# Patient Record
Sex: Female | Born: 1945
Health system: Southern US, Community
[De-identification: ages and names within clinical notes are randomized; demographics above are authoritative.]

## PROBLEM LIST (undated history)

## (undated) DIAGNOSIS — F419 Anxiety disorder, unspecified: Secondary | ICD-10-CM

## (undated) DIAGNOSIS — I10 Essential (primary) hypertension: Secondary | ICD-10-CM

## (undated) DIAGNOSIS — M5416 Radiculopathy, lumbar region: Secondary | ICD-10-CM

## (undated) DIAGNOSIS — Z923 Personal history of irradiation: Secondary | ICD-10-CM

## (undated) DIAGNOSIS — Z9221 Personal history of antineoplastic chemotherapy: Secondary | ICD-10-CM

## (undated) DIAGNOSIS — C801 Malignant (primary) neoplasm, unspecified: Secondary | ICD-10-CM

## (undated) DIAGNOSIS — J45909 Unspecified asthma, uncomplicated: Secondary | ICD-10-CM

## (undated) DIAGNOSIS — G629 Polyneuropathy, unspecified: Secondary | ICD-10-CM

## (undated) HISTORY — PX: ABDOMINAL HYSTERECTOMY: SHX81

## (undated) HISTORY — PX: EYE SURGERY: SHX253

## (undated) HISTORY — PX: TUBAL LIGATION: SHX77

## (undated) HISTORY — PX: CHOLECYSTECTOMY: SHX55

## (undated) HISTORY — DX: Polyneuropathy, unspecified: G62.9

## (undated) HISTORY — PX: BACK SURGERY: SHX140

## (undated) HISTORY — DX: Radiculopathy, lumbar region: M54.16

---

## 1995-04-21 DIAGNOSIS — C801 Malignant (primary) neoplasm, unspecified: Secondary | ICD-10-CM

## 1995-04-21 HISTORY — DX: Malignant (primary) neoplasm, unspecified: C80.1

## 1995-04-21 HISTORY — PX: BREAST LUMPECTOMY: SHX2

## 2004-08-28 ENCOUNTER — Encounter: Admission: RE | Admit: 2004-08-28 | Discharge: 2004-08-28 | Payer: Self-pay | Admitting: Internal Medicine

## 2004-09-19 ENCOUNTER — Encounter (INDEPENDENT_AMBULATORY_CARE_PROVIDER_SITE_OTHER): Payer: Self-pay | Admitting: *Deleted

## 2004-09-19 ENCOUNTER — Encounter: Admission: RE | Admit: 2004-09-19 | Discharge: 2004-09-19 | Payer: Self-pay | Admitting: Internal Medicine

## 2004-10-01 ENCOUNTER — Encounter: Admission: RE | Admit: 2004-10-01 | Discharge: 2004-10-01 | Payer: Self-pay | Admitting: Internal Medicine

## 2004-10-09 ENCOUNTER — Encounter: Admission: RE | Admit: 2004-10-09 | Discharge: 2004-10-09 | Payer: Self-pay | Admitting: Internal Medicine

## 2004-12-31 ENCOUNTER — Other Ambulatory Visit: Admission: RE | Admit: 2004-12-31 | Discharge: 2004-12-31 | Payer: Self-pay | Admitting: Internal Medicine

## 2005-02-04 ENCOUNTER — Encounter: Admission: RE | Admit: 2005-02-04 | Discharge: 2005-04-19 | Payer: Self-pay | Admitting: Internal Medicine

## 2005-09-01 ENCOUNTER — Encounter: Admission: RE | Admit: 2005-09-01 | Discharge: 2005-09-01 | Payer: Self-pay | Admitting: Internal Medicine

## 2006-01-05 ENCOUNTER — Other Ambulatory Visit: Admission: RE | Admit: 2006-01-05 | Discharge: 2006-01-05 | Payer: Self-pay | Admitting: Internal Medicine

## 2006-07-26 ENCOUNTER — Ambulatory Visit (HOSPITAL_COMMUNITY): Admission: RE | Admit: 2006-07-26 | Discharge: 2006-07-26 | Payer: Self-pay | Admitting: Ophthalmology

## 2006-09-07 ENCOUNTER — Encounter: Admission: RE | Admit: 2006-09-07 | Discharge: 2006-09-07 | Payer: Self-pay | Admitting: *Deleted

## 2007-09-14 ENCOUNTER — Encounter: Admission: RE | Admit: 2007-09-14 | Discharge: 2007-09-14 | Payer: Self-pay | Admitting: Family Medicine

## 2007-09-28 ENCOUNTER — Encounter: Admission: RE | Admit: 2007-09-28 | Discharge: 2007-09-28 | Payer: Self-pay | Admitting: Family Medicine

## 2008-09-25 ENCOUNTER — Encounter: Admission: RE | Admit: 2008-09-25 | Discharge: 2008-09-25 | Payer: Self-pay | Admitting: Family Medicine

## 2008-11-19 ENCOUNTER — Encounter: Admission: RE | Admit: 2008-11-19 | Discharge: 2008-11-19 | Payer: Self-pay | Admitting: Family Medicine

## 2009-10-01 ENCOUNTER — Encounter: Admission: RE | Admit: 2009-10-01 | Discharge: 2009-10-01 | Payer: Self-pay | Admitting: Family Medicine

## 2010-05-11 ENCOUNTER — Encounter: Payer: Self-pay | Admitting: Internal Medicine

## 2010-05-11 ENCOUNTER — Encounter: Payer: Self-pay | Admitting: Family Medicine

## 2010-05-28 ENCOUNTER — Other Ambulatory Visit: Payer: Self-pay | Admitting: Family Medicine

## 2010-05-28 DIAGNOSIS — Z9889 Other specified postprocedural states: Secondary | ICD-10-CM

## 2010-05-28 DIAGNOSIS — Z1231 Encounter for screening mammogram for malignant neoplasm of breast: Secondary | ICD-10-CM

## 2010-08-20 ENCOUNTER — Other Ambulatory Visit: Payer: Self-pay | Admitting: Orthopaedic Surgery

## 2010-08-20 DIAGNOSIS — M545 Low back pain, unspecified: Secondary | ICD-10-CM

## 2010-09-05 NOTE — Op Note (Signed)
Sylvia Simpson, Sylvia Simpson                ACCOUNT NO.:  [id number]   MEDICAL RECORD NO.:  [id number]          PATIENT TYPE:  AMB   LOCATION:  SDS                          FACILITY:  [facility]   PHYSICIAN:  Jillyn Hidden A. Rankin, M.D.   DATE OF BIRTH:  1945-11-24   DATE OF PROCEDURE:  07/26/2006  DATE OF DISCHARGE:  07/26/2006                               OPERATIVE REPORT   PREOPERATIVE DIAGNOSIS:  Macular hole, right eye.   POSTOPERATIVE DIAGNOSES:  1. Macular hole, right eye.  2. __________  holes, inferonasally.   PROCEDURES PERFORMED:  1. Posterior vitrectomy with membrane peel using indocyanine green      (ICG) ___dye  delineation - internal limiting membrane.  2. Pars plana vitrectomy endolaser photocoagulation, local, right eye      - 25 gauge.   SURGEON:  Alford Highland. Rankin, M.D.   ANESTHESIA:  Local with monitored anesthesia control.   INDICATIONS FOR PROCEDURE:  The patient is a 65 year old woman who has  profound vision loss on the basis of a macular hole, idiopathic, of the  right eye.  The patient understands this is an attempt to close the  macular hole.  She understands the risks of anesthesia, including the  rare occurrence of death, loss of the eye, including limbic condition as  well as the underlying repair, but not limited to hemorrhage, infection,  scarring, need for further surgery, no change in vision, __________ .   DESCRIPTION OF PROCEDURE:  In the operating room, appropriate monitoring  was followed by sedation.  The globe was anesthetized using 0.75%  Marcaine retrobulbar with an additional 5 mL lidocaine/0.5 of Marcaine.  The right periocular region was sterilely prepped and draped in the  usual sterile fashion.  A lid speculum was applied.  Next, a 25-gauge  trocar system was placed in the inferotemporal quadrant.  A superior  trocar was applied.  _______infusion___  was then begun.  Physician  induced posterior vitreous detachment was created.  The vitreous  detachment was created.  Vitreous _skirt elevated  360 degrees and  trimmed.  At this time, fluid air exchange completed.  A small amount of  BSS was left over the posterior hole and dilute 1 mL of ICG with 5 mL of  BSS injected over the posterior hole.  This was immediately aspirated  with a soft-tipped silicone needle.  Thereafter, an air fluid exchange  was completed.  Under fluid, the internal membrane was easily identified  and was run in a continuous circular fashion from around the edge of the  macular hole.  Additional findings of retinal holes inferonasal and one  inferotemporally.  These were treated with local laser photocoagulation.  Excellent mobilization of the edge of the macular hole was completed.  At this time, a fluid air exchange was completed.  At this time, an air-  SF-6 exchange completed with 20% SF-6.   The superior trocar was removed.  The infusion was removed.  Conjunctival Decadron applied.  Sterile packs and Fox shield applied.  The patient tolerated the procedure without complication.      Alford Highland  Rankin, M.D.  Electronically Signed     GAR/MEDQ  D:  09/19/2006  T:  09/19/2006  Job:  [id number]

## 2010-10-07 ENCOUNTER — Ambulatory Visit
Admission: RE | Admit: 2010-10-07 | Discharge: 2010-10-07 | Disposition: A | Discharge status: Home / Self Care | Payer: BLUE CROSS/BLUE SHIELD | Source: Ambulatory Visit | Attending: Family Medicine | Admitting: Family Medicine

## 2010-10-07 DIAGNOSIS — Z9889 Other specified postprocedural states: Secondary | ICD-10-CM

## 2010-10-07 DIAGNOSIS — Z1231 Encounter for screening mammogram for malignant neoplasm of breast: Secondary | ICD-10-CM

## 2010-12-11 ENCOUNTER — Ambulatory Visit
Admission: RE | Admit: 2010-12-11 | Discharge: 2010-12-11 | Disposition: A | Discharge status: Home / Self Care | Payer: BLUE CROSS/BLUE SHIELD | Source: Ambulatory Visit | Attending: Orthopaedic Surgery | Admitting: Orthopaedic Surgery

## 2010-12-11 DIAGNOSIS — M545 Low back pain, unspecified: Secondary | ICD-10-CM

## 2011-01-22 ENCOUNTER — Other Ambulatory Visit (HOSPITAL_COMMUNITY): Payer: Self-pay | Admitting: Orthopaedic Surgery

## 2011-01-22 ENCOUNTER — Ambulatory Visit (HOSPITAL_COMMUNITY)
Admission: RE | Admit: 2011-01-22 | Discharge: 2011-01-22 | Disposition: A | Discharge status: Home / Self Care | Payer: Medicare Other | Source: Ambulatory Visit | Attending: Orthopaedic Surgery | Admitting: Orthopaedic Surgery

## 2011-01-22 ENCOUNTER — Encounter (HOSPITAL_COMMUNITY)
Admission: RE | Admit: 2011-01-22 | Discharge: 2011-01-22 | Disposition: A | Discharge status: Home / Self Care | Payer: Medicare Other | Source: Ambulatory Visit | Attending: Orthopaedic Surgery | Admitting: Orthopaedic Surgery

## 2011-01-22 DIAGNOSIS — I517 Cardiomegaly: Secondary | ICD-10-CM | POA: Insufficient documentation

## 2011-01-22 DIAGNOSIS — I440 Atrioventricular block, first degree: Secondary | ICD-10-CM | POA: Insufficient documentation

## 2011-01-22 DIAGNOSIS — Z01812 Encounter for preprocedural laboratory examination: Secondary | ICD-10-CM | POA: Insufficient documentation

## 2011-01-22 DIAGNOSIS — Z01811 Encounter for preprocedural respiratory examination: Secondary | ICD-10-CM

## 2011-01-22 DIAGNOSIS — Z01818 Encounter for other preprocedural examination: Secondary | ICD-10-CM | POA: Insufficient documentation

## 2011-01-22 DIAGNOSIS — Z01810 Encounter for preprocedural cardiovascular examination: Secondary | ICD-10-CM | POA: Insufficient documentation

## 2011-01-22 LAB — TYPE AND SCREEN
ABO/RH(D): A POS
Antibody Screen: NEGATIVE

## 2011-01-22 LAB — URINALYSIS, ROUTINE W REFLEX MICROSCOPIC
Leukocytes, UA: NEGATIVE
Protein, ur: NEGATIVE mg/dL
Urobilinogen, UA: 0.2 mg/dL (ref 0.0–1.0)

## 2011-01-22 LAB — DIFFERENTIAL
Basophils Absolute: 0 10*3/uL (ref 0.0–0.1)
Eosinophils Absolute: 0.1 10*3/uL (ref 0.0–0.7)
Monocytes Absolute: 0.4 10*3/uL (ref 0.1–1.0)
Monocytes Relative: 7 % (ref 3–12)

## 2011-01-22 LAB — PROTIME-INR
INR: 1.03 (ref 0.00–1.49)
Prothrombin Time: 13.7 seconds (ref 11.6–15.2)

## 2011-01-22 LAB — BASIC METABOLIC PANEL
Calcium: 11.5 mg/dL — ABNORMAL HIGH (ref 8.4–10.5)
GFR calc non Af Amer: 52 mL/min — ABNORMAL LOW (ref >90)
Sodium: 141 mEq/L (ref 135–145)

## 2011-01-22 LAB — CBC
Platelets: 184 10*3/uL (ref 150–400)
RBC: 4.19 MIL/uL (ref 3.87–5.11)
WBC: 5 10*3/uL (ref 4.0–10.5)

## 2011-01-22 LAB — ABO/RH: ABO/RH(D): A POS

## 2011-02-04 ENCOUNTER — Inpatient Hospital Stay (HOSPITAL_COMMUNITY)
Admission: RE | Admit: 2011-02-04 | Discharge: 2011-02-10 | DRG: 460 | Disposition: A | Discharge status: Home Health | Payer: Medicare Other | Source: Ambulatory Visit | Attending: Orthopaedic Surgery | Admitting: Orthopaedic Surgery

## 2011-02-04 ENCOUNTER — Inpatient Hospital Stay (HOSPITAL_COMMUNITY): Payer: Medicare Other

## 2011-02-04 DIAGNOSIS — M5126 Other intervertebral disc displacement, lumbar region: Principal | ICD-10-CM | POA: Diagnosis present

## 2011-02-04 DIAGNOSIS — M4310 Spondylolisthesis, site unspecified: Secondary | ICD-10-CM | POA: Diagnosis present

## 2011-02-04 DIAGNOSIS — I10 Essential (primary) hypertension: Secondary | ICD-10-CM | POA: Diagnosis present

## 2011-02-04 DIAGNOSIS — D72829 Elevated white blood cell count, unspecified: Secondary | ICD-10-CM | POA: Diagnosis not present

## 2011-02-04 DIAGNOSIS — J9819 Other pulmonary collapse: Secondary | ICD-10-CM | POA: Diagnosis not present

## 2011-02-05 LAB — BASIC METABOLIC PANEL
BUN: 19 mg/dL (ref 6–23)
GFR calc non Af Amer: 44 mL/min — ABNORMAL LOW (ref >90)
Glucose, Bld: 349 mg/dL — ABNORMAL HIGH (ref 70–99)
Potassium: 5.2 mEq/L — ABNORMAL HIGH (ref 3.5–5.1)

## 2011-02-05 LAB — CBC
HCT: 25 % — ABNORMAL LOW (ref 36.0–46.0)
Hemoglobin: 8.3 g/dL — ABNORMAL LOW (ref 12.0–15.0)
MCH: 26.9 pg (ref 26.0–34.0)
MCHC: 33.2 g/dL (ref 30.0–36.0)

## 2011-02-06 ENCOUNTER — Inpatient Hospital Stay (HOSPITAL_COMMUNITY): Payer: Medicare Other

## 2011-02-06 DIAGNOSIS — J690 Pneumonitis due to inhalation of food and vomit: Secondary | ICD-10-CM

## 2011-02-06 DIAGNOSIS — A419 Sepsis, unspecified organism: Secondary | ICD-10-CM

## 2011-02-06 DIAGNOSIS — R509 Fever, unspecified: Secondary | ICD-10-CM

## 2011-02-06 LAB — CBC
HCT: 20.6 % — ABNORMAL LOW (ref 36.0–46.0)
HCT: 21.6 % — ABNORMAL LOW (ref 36.0–46.0)
Hemoglobin: 8.5 g/dL — ABNORMAL LOW (ref 12.0–15.0)
Hemoglobin: 7 g/dL — ABNORMAL LOW (ref 12.0–15.0)
MCH: 26.8 pg (ref 26.0–34.0)
MCH: 27 pg (ref 26.0–34.0)
MCHC: 33.9 g/dL (ref 30.0–36.0)
MCV: 79.2 fL (ref 78.0–100.0)
MCV: 80 fL (ref 78.0–100.0)
Platelets: 121 10*3/uL — ABNORMAL LOW (ref 150–400)
RBC: 2.6 MIL/uL — ABNORMAL LOW (ref 3.87–5.11)
RBC: 2.7 MIL/uL — ABNORMAL LOW (ref 3.87–5.11)
WBC: 10.7 10*3/uL — ABNORMAL HIGH (ref 4.0–10.5)
WBC: 11.7 10*3/uL — ABNORMAL HIGH (ref 4.0–10.5)

## 2011-02-06 LAB — DIFFERENTIAL
Basophils Relative: 0 % (ref 0–1)
Eosinophils Absolute: 0 10*3/uL (ref 0.0–0.7)
Eosinophils Relative: 0 % (ref 0–5)
Lymphocytes Relative: 16 % (ref 12–46)
Lymphocytes Relative: 16 % (ref 12–46)
Lymphs Abs: 1.7 10*3/uL (ref 0.7–4.0)
Monocytes Absolute: 0.6 10*3/uL (ref 0.1–1.0)
Monocytes Relative: 5 % (ref 3–12)
Neutro Abs: 8.4 10*3/uL — ABNORMAL HIGH (ref 1.7–7.7)
Neutro Abs: 9.1 10*3/uL — ABNORMAL HIGH (ref 1.7–7.7)
Neutrophils Relative %: 79 % — ABNORMAL HIGH (ref 43–77)
Neutrophils Relative %: 78 % — ABNORMAL HIGH (ref 43–77)

## 2011-02-06 LAB — COMPREHENSIVE METABOLIC PANEL
ALT: 27 U/L (ref 0–35)
BUN: 17 mg/dL (ref 6–23)
Calcium: 8.8 mg/dL (ref 8.4–10.5)
Creatinine, Ser: 1.41 mg/dL — ABNORMAL HIGH (ref 0.50–1.10)
GFR calc Af Amer: 44 mL/min — ABNORMAL LOW (ref >90)
Glucose, Bld: 141 mg/dL — ABNORMAL HIGH (ref 70–99)
Sodium: 136 mEq/L (ref 135–145)
Total Protein: 6.2 g/dL (ref 6.0–8.3)

## 2011-02-06 LAB — CORTISOL: Cortisol, Plasma: 14.9 ug/dL

## 2011-02-06 LAB — CARDIAC PANEL(CRET KIN+CKTOT+MB+TROPI)
CK, MB: 13.5 ng/mL (ref 0.3–4.0)
Relative Index: 0.2 (ref 0.0–2.5)
Troponin I: <0.3 ng/mL (ref <0.30)

## 2011-02-06 LAB — URINALYSIS, ROUTINE W REFLEX MICROSCOPIC
Bilirubin Urine: NEGATIVE
Glucose, UA: NEGATIVE mg/dL
Ketones, ur: NEGATIVE mg/dL
Leukocytes, UA: NEGATIVE
Nitrite: NEGATIVE
Protein, ur: NEGATIVE mg/dL

## 2011-02-06 LAB — PROCALCITONIN: Procalcitonin: 1.62 ng/mL

## 2011-02-06 NOTE — Op Note (Signed)
NAMESHIKHA, BIBB NO.:  [id number]  MEDICAL RECORD NO.:  [id number]  LOCATION:  5015                         FACILITY:  [facility]  PHYSICIAN:  Tawan Degroote C. Ophelia Charter, M.D.    DATE OF BIRTH:  [birth year]  DATE OF PROCEDURE:  02/04/2011 DATE OF DISCHARGE:                              OPERATIVE REPORT   PREOPERATIVE DIAGNOSES:  L4-5 unstable spondylolisthesis with biforaminal stenosis and central stenosis.  L5-S1 disk protrusion with biforaminal stenosis and lateral recess stenosis.  POSTOPERATIVE DIAGNOSES:  L4-5 unstable spondylolisthesis with biforaminal stenosis and central stenosis.  L5-S1 disk protrusion with biforaminal stenosis and lateral recess stenosis.  PROCEDURE:  L4-5 Gill decompression and interbody fusion with local bone, PEEK cage,bilateral lateral tranverse process lateral fusion. L5-S1  Right TLIF with local bone PEEK cage interbody bone and bilateral tranverse process fusion.  L4-S1 segmental pedicle instrumentation with [employer] pedicle system.  6.5 screws in L4 and L5, ranging 40 to 45 mm and 30 mm 7.5 S1 screws.  Large cross-link.  9-mm cage at 4-5, 11 mm at L5-S1.  Bilateral lateral intertransverse process fusion, L4- S1. 2 level fusion pedicle screws L4,L5 and S1. Local bone used for interbody and lateral fusions.  SURGEON:  Sylena Lotter C. Ophelia Charter, MD  ASSISTANT:  Wende Neighbors, PA-C, medically necessary and present for the entire procedure.  ESTIMATED BLOOD LOSS:  700 mL, re-transfusion 250 cell Saver.  DRAINS:  None.  COMPLICATIONS:  None.  INDICATIONS:  This is a 65 year old female who has had persistent problems with nerve root compression, unstable anterolisthesis with a 5- 6 mm of shift on flexion/extension x-rays with severe foraminal stenosis, right greater the left at L4-5 with abnormal facets and central lateral recess and foraminal stenosis.  At L5-S1, she had a degenerative disk, broad-based disk protrusion, lateral recess  stenosis, and foraminal stenosis to a lesser degree without instability.  She had failed conservative treatment including therapy, walking, exercise program.  Has been taking narcotic medications with increasing dosage, failed epidural steroid injections, and was intact neurologically, but had claudication symptoms with standing after several minutes and limitation of ambulation distance, which was progressive.  DESCRIPTION OF PROCEDURE:  After induction of general anesthesia, the patient placed prone on chest rolls, warmer Ancef prophylaxis, DuraPrep, and time-out procedure was completed with DuraPrep dry.  Ancef was given at the beginning of the case.  It was re-dosed 3 hours later.  Area was squared with towels.  Betadine and Steri-Drape applied and laminectomy sheets and drapes.  Midline incision was made.  Subperiosteal dissection on the lamina and after exposure, Kocher's were placed at the planned level of decompression with removal of the lamina at L4 with complete laminectomy at L5 biforaminal decompression with removal of large abnormal facets at particularly 4-5 level where there was instability. In the prone position on chest rolls, she did reduce and did not have the 5-6 mm of anterolisthesis and was corrected back to neutral position.  Thick chunks of ligament were removed, biforaminal decompression was performed, and normal facet was divided after the disk was identified and confirmed with lateral spot fluoro picture, and the facet was removed on the right with exposure of the disk.  Diskectomy was performed.  All  the tools including angled curettes, straight curettes, ring curettes, angled and straight rasp, straight pituitaries, up and down pituitaries, and Epstein type curettes were all used to perform the diskectomy and endplate preparation.  Sizes from 7 and 9 was performed, 9 gave an excellent tight fit, checked under fluoroscopy with restoration of the space and  good correction of anterolisthesis, it was actually difficult to get the trial out.  Copious amounts of the bone from the lamina, abnormal facets were removed, and foraminal bone that had been removed were cleaned and then packed down into the disk space, impacted using the straight impactor, angled impactor using the #7 trial as an impactor with a hammer and then finally placement of the PEEK 9-mm lordotic cage filled with bone in the middle after copious bone had been placed anteriorly to the point where the 7 mm metal trial had trouble fitting into the disk space.  PEEK cage was pounded in place, kicked over, checked under fluoroscopy was advanced, once it was countersunk that is were nearly overlying showing that it had been kicked appropriately over and was not in vertical position.  The second level was addressed.  Operative microscope was used throughout the case for exposure the microdiskectomy.  Very thick chunks of ligament and large overhanging spurs occupied at least 40% of the canal at 4-5 level, worse on the right than left.  At 5-1 level, there was again hypertrophic ligamentum multifactorial central stenosis with decompression done centrally.  Diskectomy was performed after removal of the abnormal facet on the right side.  Once the process had been repeated, a cage was placed at the 5-1 level.  Sequential preparation of the placement screws were performed using the awl checking under fluoro using the joystick advancing partially checking under fluoro and advancing, intermittently checking under fluoro ball-tipped probe, pedicle feeler followed by tapping ball-tipped probe, pedicle feeler, decortication of the transverse processes and lateral gutter up suite and then placement of appropriate screw as listed above.  Once all 6 screws were confirmed a good position, cross-link large was placed since it was a two-level fusion.  The screws were tightened down with the caps at  100 pounds and no tightening was performed until the compression was performed on the TLIF side initially on the right and then on the left side with tightening of the screws in S1 first and then progressively tightening of the screws.  There was a final bone graft left, which was packed more on the L4-5 interspace, and the rest remaining the 5-1 interspace. Cages were checked again to make sure there was no bone in the canal. Fascia was closed with #1 interrupted Vicryl, 2-0 Vicryl subcutaneous tissue, subcuticular skin closure.  Instrument count and needle count was correct.  Final spot x-rays were taken confirming good position and alignment.  The patient was transferred to the recovery room after application of dressing.     Alayah Knouff C. Ophelia Charter, M.D.     MCY/MEDQ  D:  02/04/2011  T:  02/05/2011  Job:  [id number]  Electronically Signed by Annell Greening M.D. on 02/06/2011 06:03:30 PM

## 2011-02-07 ENCOUNTER — Inpatient Hospital Stay (HOSPITAL_COMMUNITY): Payer: Medicare Other

## 2011-02-07 DIAGNOSIS — M47817 Spondylosis without myelopathy or radiculopathy, lumbosacral region: Secondary | ICD-10-CM

## 2011-02-07 DIAGNOSIS — J9819 Other pulmonary collapse: Secondary | ICD-10-CM

## 2011-02-07 LAB — COMPREHENSIVE METABOLIC PANEL
ALT: 28 U/L (ref 0–35)
Alkaline Phosphatase: 66 U/L (ref 39–117)
BUN: 12 mg/dL (ref 6–23)
CO2: 26 mEq/L (ref 19–32)
Calcium: 8.8 mg/dL (ref 8.4–10.5)
GFR calc Af Amer: 59 mL/min — ABNORMAL LOW (ref >90)
GFR calc non Af Amer: 51 mL/min — ABNORMAL LOW (ref >90)
Glucose, Bld: 124 mg/dL — ABNORMAL HIGH (ref 70–99)
Potassium: 3.7 mEq/L (ref 3.5–5.1)
Total Protein: 6.1 g/dL (ref 6.0–8.3)

## 2011-02-07 LAB — CBC
HCT: 23.3 % — ABNORMAL LOW (ref 36.0–46.0)
HCT: 25.8 % — ABNORMAL LOW (ref 36.0–46.0)
Hemoglobin: 7.9 g/dL — ABNORMAL LOW (ref 12.0–15.0)
Hemoglobin: 9 g/dL — ABNORMAL LOW (ref 12.0–15.0)
MCH: 27.5 pg (ref 26.0–34.0)
MCH: 28.4 pg (ref 26.0–34.0)
MCHC: 33.9 g/dL (ref 30.0–36.0)
MCHC: 34.9 g/dL (ref 30.0–36.0)
MCV: 81.4 fL (ref 78.0–100.0)
RBC: 2.87 MIL/uL — ABNORMAL LOW (ref 3.87–5.11)
RBC: 3.17 MIL/uL — ABNORMAL LOW (ref 3.87–5.11)

## 2011-02-07 LAB — GLUCOSE, CAPILLARY: Glucose-Capillary: 128 mg/dL — ABNORMAL HIGH (ref 70–99)

## 2011-02-07 LAB — URINE CULTURE
Colony Count: NO GROWTH
Culture  Setup Time: 201210191358
Culture: NO GROWTH

## 2011-02-07 LAB — PROCALCITONIN: Procalcitonin: 1.52 ng/mL

## 2011-02-08 ENCOUNTER — Inpatient Hospital Stay (HOSPITAL_COMMUNITY): Payer: Medicare Other

## 2011-02-08 DIAGNOSIS — R509 Fever, unspecified: Secondary | ICD-10-CM

## 2011-02-08 DIAGNOSIS — M47817 Spondylosis without myelopathy or radiculopathy, lumbosacral region: Secondary | ICD-10-CM

## 2011-02-08 DIAGNOSIS — J690 Pneumonitis due to inhalation of food and vomit: Secondary | ICD-10-CM

## 2011-02-08 DIAGNOSIS — J9819 Other pulmonary collapse: Secondary | ICD-10-CM

## 2011-02-08 LAB — COMPREHENSIVE METABOLIC PANEL WITH GFR
ALT: 27 U/L (ref 0–35)
AST: 58 U/L — ABNORMAL HIGH (ref 0–37)
Albumin: 2.2 g/dL — ABNORMAL LOW (ref 3.5–5.2)
Alkaline Phosphatase: 78 U/L (ref 39–117)
BUN: 11 mg/dL (ref 6–23)
CO2: 27 meq/L (ref 19–32)
Calcium: 8.9 mg/dL (ref 8.4–10.5)
Chloride: 108 meq/L (ref 96–112)
Creatinine, Ser: 0.89 mg/dL (ref 0.50–1.10)
GFR calc Af Amer: 77 mL/min — ABNORMAL LOW
GFR calc non Af Amer: 66 mL/min — ABNORMAL LOW
Glucose, Bld: 107 mg/dL — ABNORMAL HIGH (ref 70–99)
Potassium: 3.4 meq/L — ABNORMAL LOW (ref 3.5–5.1)
Sodium: 140 meq/L (ref 135–145)
Total Bilirubin: 0.9 mg/dL (ref 0.3–1.2)
Total Protein: 5.8 g/dL — ABNORMAL LOW (ref 6.0–8.3)

## 2011-02-08 LAB — GLUCOSE, CAPILLARY
Glucose-Capillary: 108 mg/dL — ABNORMAL HIGH (ref 70–99)
Glucose-Capillary: 110 mg/dL — ABNORMAL HIGH (ref 70–99)
Glucose-Capillary: 120 mg/dL — ABNORMAL HIGH (ref 70–99)
Glucose-Capillary: 55 mg/dL — ABNORMAL LOW (ref 70–99)
Glucose-Capillary: 81 mg/dL (ref 70–99)

## 2011-02-08 LAB — CROSSMATCH: Unit division: 0

## 2011-02-08 LAB — CBC
HCT: 24.8 % — ABNORMAL LOW (ref 36.0–46.0)
Hemoglobin: 8.5 g/dL — ABNORMAL LOW (ref 12.0–15.0)
MCH: 28.1 pg (ref 26.0–34.0)
MCHC: 34.3 g/dL (ref 30.0–36.0)
MCV: 82.1 fL (ref 78.0–100.0)
Platelets: 135 10*3/uL — ABNORMAL LOW (ref 150–400)
RBC: 3.02 MIL/uL — ABNORMAL LOW (ref 3.87–5.11)
RDW: 15.1 % (ref 11.5–15.5)
WBC: 7.9 10*3/uL (ref 4.0–10.5)

## 2011-02-09 LAB — GLUCOSE, CAPILLARY
Glucose-Capillary: 138 mg/dL — ABNORMAL HIGH (ref 70–99)
Glucose-Capillary: 107 mg/dL — ABNORMAL HIGH (ref 70–99)
Glucose-Capillary: 103 mg/dL — ABNORMAL HIGH (ref 70–99)
Glucose-Capillary: 91 mg/dL (ref 70–99)

## 2011-02-10 LAB — GLUCOSE, CAPILLARY: Glucose-Capillary: 113 mg/dL — ABNORMAL HIGH (ref 70–99)

## 2011-02-12 LAB — CULTURE, BLOOD (ROUTINE X 2)
Culture  Setup Time: 201210191728
Culture: NO GROWTH

## 2011-03-09 NOTE — Discharge Summary (Signed)
Sylvia Simpson, Sylvia Simpson NO.:  [id number]  MEDICAL RECORD NO.:  [id number]  LOCATION:  5004                         FACILITY:  [facility]  PHYSICIAN:  Mark C. Ophelia Charter, M.D.    DATE OF BIRTH:  11-03-1945  DATE OF ADMISSION:  02/04/2011 DATE OF DISCHARGE:  02/10/2011                              DISCHARGE SUMMARY   ADMISSION DIAGNOSES: 1. L4-5 unstable spondylolisthesis with biforaminal stenosis and     central stenosis. 2. L5-S1 disk protrusion with biforaminal stenosis and lateral recess     stenosis. 3. History of breast cancer status post mastectomy and chemo and     radiation therapy. 4. Status post cholecystectomy, status post hysterectomy.  DISCHARGE DIAGNOSES: 1. L4-5 unstable spondylolisthesis with biforaminal stenosis and     central stenosis. 2. L5-S1 disk protrusion with biforaminal     stenosis and lateral recess stenosis. 2. History of breast cancer status post mastectomy and chemo and     radiation therapy. 3. Status post cholecystectomy, status post hysterectomy. 4. Postoperative right lower lobe atelectasis and pneumonia. 5. Urinary retention resolved.  PROCEDURE:  On February 04, 2011, the patient underwent L4-5 Gill decompression with interbody fusion, and using local bone graft PEEK cage bilateral lateral transverse process lateral fusion.  L5-S1 right transforaminal lumbar interbody fusion with local bone graft PEEK cage interbody fusion and bilateral transverse process fusion.  L4-5 segmental pedicle screw and rod instrumentation.  This was performed by Dr. Ophelia Charter assisted by Maud Deed, PA-C under general anesthesia.  CONSULTATIONS:  The hospital intensivist.  BRIEF HISTORY:  The patient is a 65 year old female with persistent back pain and lower extremity neurogenic claudication.  Radiographs have shown unstable anterolisthesis at L4-5 with a 6-mm shift on flexion- extension x-rays with severe foraminal stenosis right greater than left at  L4-5.  At L5-S1, she was noted to have degenerative disk broad-based disk protrusion, lateral recess stenosis and foraminal stenosis without instability.  The patient failed conservative treatment including physical therapy, activity modification, epidural steroid injections and narcotic medications.  She continued to have neurogenic claudication symptoms with standing and walking, which is inhibiting her from doing her usual daily activities.  It was felt she would benefit from surgical intervention and was admitted for the procedure as stated above.  BRIEF HOSPITAL COURSE:  The patient tolerated the procedure under general anesthesia without complications.  Postoperatively, neurovascular motor function of the lower extremities was noted to be intact.  The patient's hemoglobin was 8.3 on the first postoperative day.  Physical therapy was initiated.  Later on the first postoperative day, she developed a high fever at 102.7.  The patient at that time was using large amount of narcotic analgesics and was very sedated.  The patient was also unable to void following discontinuation of her Foley catheter.  She continued to be very slow to respond, and her heart rate was noted to be elevated at 125 to 128.  An EKG was obtained and rapid response was called.  Dr. Ophelia Charter also saw the patient.  A triple lumen subclavian central line was placed by Dr. Tyson Alias at the bedside.  The patient was transferred to the intensive care unit.  Her Foley was reinserted as she was unable  to void.  Chest x-ray was obtained as well as blood cultures and urinalysis.  Findings were positive for right lower lobe atelectasis, possible pneumonia.  She was started on IV antibiotics.  She remained in the intensive care unit for 2-3 days at which time she underwent a strain with pulmonary toilet.  She also continued to receive physical therapy.  Her narcotic analgesics were decreased significantly and pain was eventually  controlled with oral analgesics.  The patient was on Zosyn and vanc during the stay in the intensive care unit.  This was eventually changed to Avelox and her dose of antibiotics was completed during the hospital stay.  She did develop a post hemorrhagic anemia with hemoglobin dropping to 7.9.  She received a total of 2 units of packed red blood cells during the hospital stay. Her Foley catheter was eventually removed, and she was able to void independently.  The patient was transferred back to the orthopedic floor at which time, she continued with her physical therapy.  She was wearing her Aspen LSO and donning and doffing the brace appropriately.  The patient was tolerating oral analgesics.  Her IV fluids were discontinued when she was able to return to her regular diet.  Her wound was healing well throughout the hospital stay.  On February 10, 2011, the patient was continuing to progress quite well.  She was ambulating 150 feet with physical therapy and was cleared for discharge from their standpoint. She received her last dose of Avelox on February 10, 2011.  She was comfortable with oral analgesics and was able to be discharged to her home with arrangements made for home health physical therapy evaluation and durable medical equipment.  PERTINENT LABORATORY VALUES:  Postoperative hemoglobin and hematocrit 8.3 and 25.0.  Hemoglobin dropped to the lowest value of 7.0 with hematocrit 20.6.  On February 08, 2011, hemoglobin 8.5, hematocrit 24.8, after 2 units of packed red blood cells.  Platelet count at discharge was 135.  Chemistry studies with significantly elevated glucose at 349 on the first postoperative day.  Once the patient is stabilized in the unit, her blood sugars returned to normal values.  Elevated creatinine at 1.25 on the first postoperative day normalized prior to discharge. All other values were normal at the time of discharge.  Comprehensive chemistries at discharge  include total protein of 5.8, albumin 2.2, AST 58, remaining values normal.  Cardiac enzymes was obtained on February 06, 2011, showed CKs 5961, CK MB 13.5, troponin less than 0.30. Cortisol level on February 06, 2011, was 14.9.  Prolactin 1.62 on February 06, 2011,  on February 07, 2011, 1.52.  Urinalysis on February 06, 2011, was negative for urinary tract infection.  Blood cultures were negative during the hospital stay.  Chest x-ray on February 06, 2011, showed right basilar heterogeneous opacities representing atelectasis though developing infection could not be excluded.  PLAN:  Arrangements were made for the patient to be discharged to her home with home health physical therapy evaluation.  Durable medical equipment was made available to the patient.  She will continue on a low- carbohydrate diet.  She will ambulate with a walker and wear a brace at all times when out of bed.  She is to avoid bending, lifting or twisting.  The patient will change her dressing daily or as needed.  She may leave this dress or undressed at 10 days postop.  The patient will follow up with Dr. Ophelia Charter Tuesday, February 17, 2011, at 9:30 a.m. Medication reconciliation  form was given to the patient with instructions on resuming her home medications.  She was given prescriptions for gabapentin 100 mg p.o. at bedtime, methocarbamol 500 mg 1 q.6 h. p.r.n. spasm, Percocet 5/325, 1-2 every 4-6 hours as needed for pain.  She will call the office should she have questions or concerns prior to her return office visit.  CONDITION ON DISCHARGE:  Stable.     Wende Neighbors, P.A.   ______________________________ Veverly Fells Ophelia Charter, M.D.    SMV/MEDQ  D:  03/09/2011  T:  03/09/2011  Job:  [id number]

## 2011-06-01 ENCOUNTER — Other Ambulatory Visit: Payer: Self-pay | Admitting: Family Medicine

## 2011-06-01 DIAGNOSIS — Z9889 Other specified postprocedural states: Secondary | ICD-10-CM

## 2011-06-01 DIAGNOSIS — Z1231 Encounter for screening mammogram for malignant neoplasm of breast: Secondary | ICD-10-CM

## 2011-10-09 ENCOUNTER — Ambulatory Visit
Admission: RE | Admit: 2011-10-09 | Discharge: 2011-10-09 | Disposition: A | Discharge status: Home / Self Care | Payer: Medicare Other | Source: Ambulatory Visit | Attending: Family Medicine | Admitting: Family Medicine

## 2011-10-09 ENCOUNTER — Other Ambulatory Visit: Payer: Self-pay | Admitting: Family Medicine

## 2011-10-09 DIAGNOSIS — Z1231 Encounter for screening mammogram for malignant neoplasm of breast: Secondary | ICD-10-CM

## 2011-10-09 DIAGNOSIS — Z9889 Other specified postprocedural states: Secondary | ICD-10-CM

## 2012-09-09 ENCOUNTER — Other Ambulatory Visit: Payer: Self-pay

## 2012-09-09 DIAGNOSIS — Z9889 Other specified postprocedural states: Secondary | ICD-10-CM

## 2012-09-09 DIAGNOSIS — Z1231 Encounter for screening mammogram for malignant neoplasm of breast: Secondary | ICD-10-CM

## 2012-09-09 DIAGNOSIS — Z853 Personal history of malignant neoplasm of breast: Secondary | ICD-10-CM

## 2012-10-14 ENCOUNTER — Ambulatory Visit
Admission: RE | Admit: 2012-10-14 | Discharge: 2012-10-14 | Disposition: A | Discharge status: Home / Self Care | Payer: Medicare Other | Source: Ambulatory Visit

## 2012-10-14 DIAGNOSIS — Z9889 Other specified postprocedural states: Secondary | ICD-10-CM

## 2012-10-14 DIAGNOSIS — Z1231 Encounter for screening mammogram for malignant neoplasm of breast: Secondary | ICD-10-CM

## 2012-10-14 DIAGNOSIS — Z853 Personal history of malignant neoplasm of breast: Secondary | ICD-10-CM

## 2013-02-15 IMAGING — CR DG CHEST 2V
2 series · 2 of 2 positions shown · non-contrast
Comparison: 07/26/2006

CLINICAL DATA: Preoperative evaluation for thoracic and lumbar
internal fixation.  No current chest complaints.  Hypertension
controlled medically.  Nonsmoker

CHEST - 2 VIEW

[view not recorded (1 of 2)]
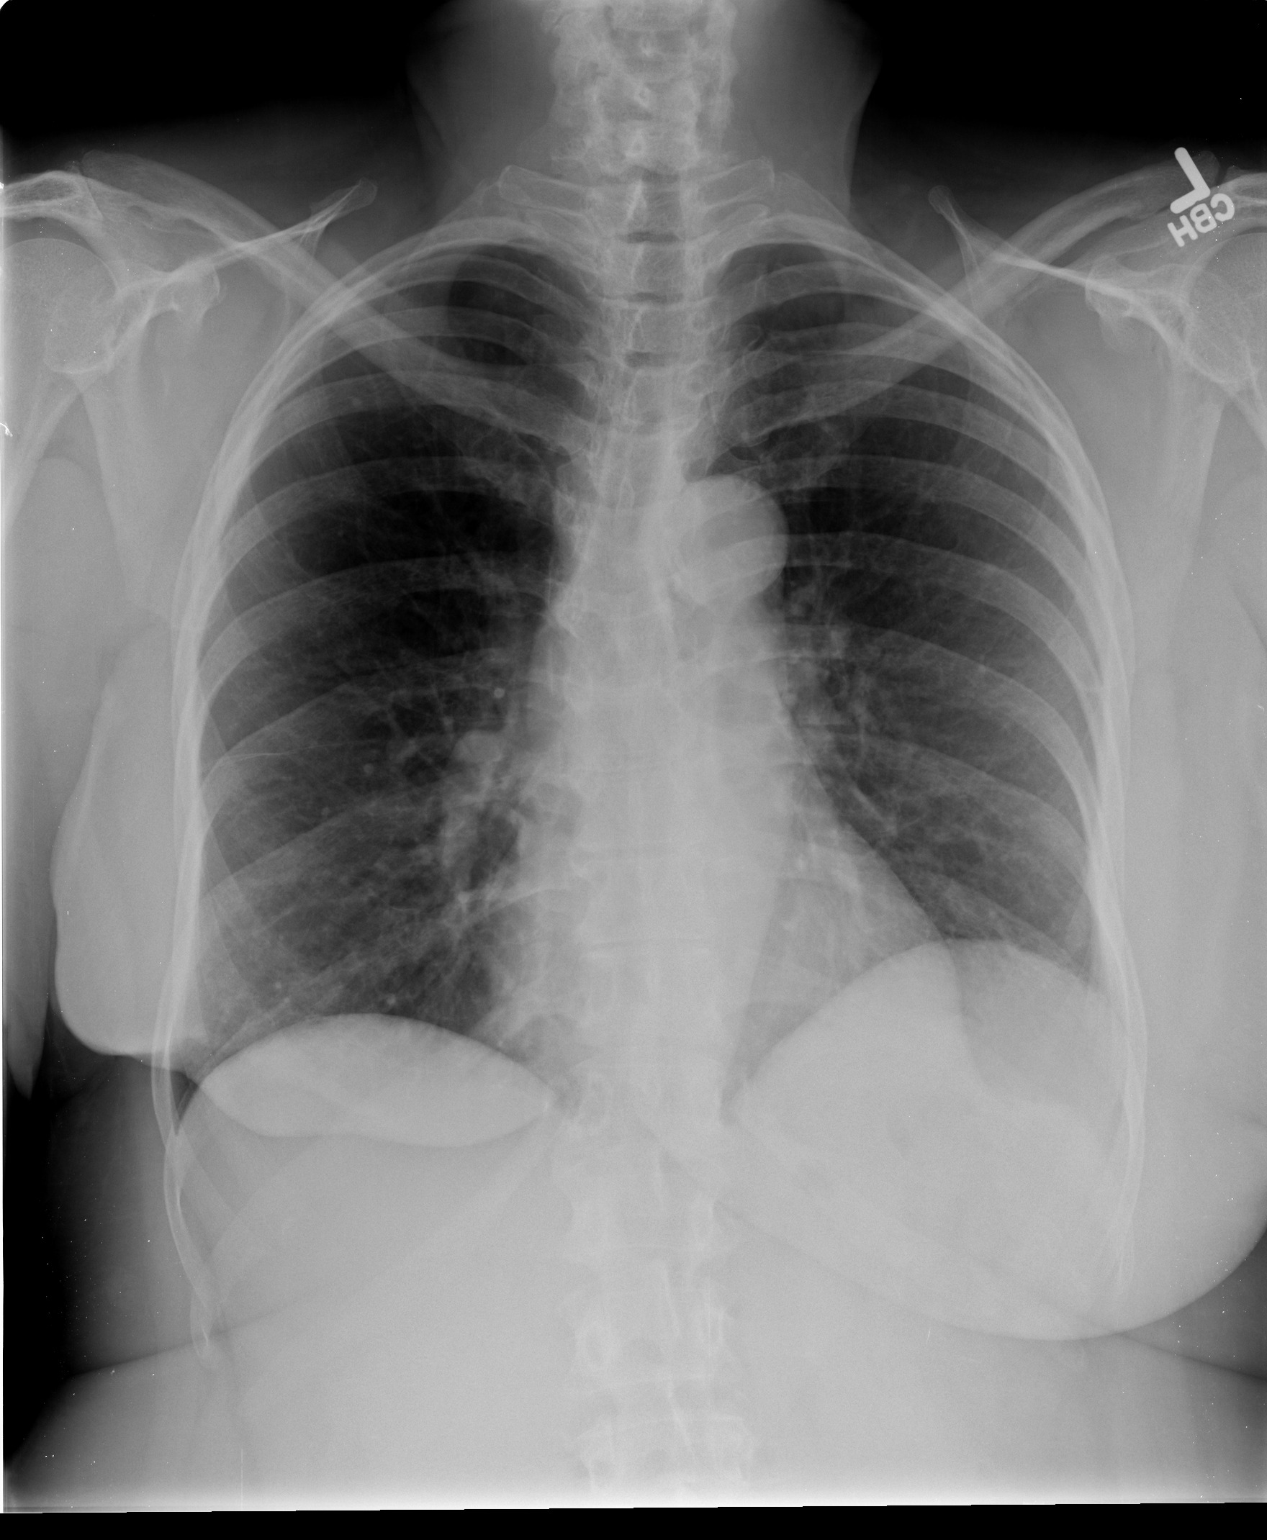

[view not recorded (2 of 2)]
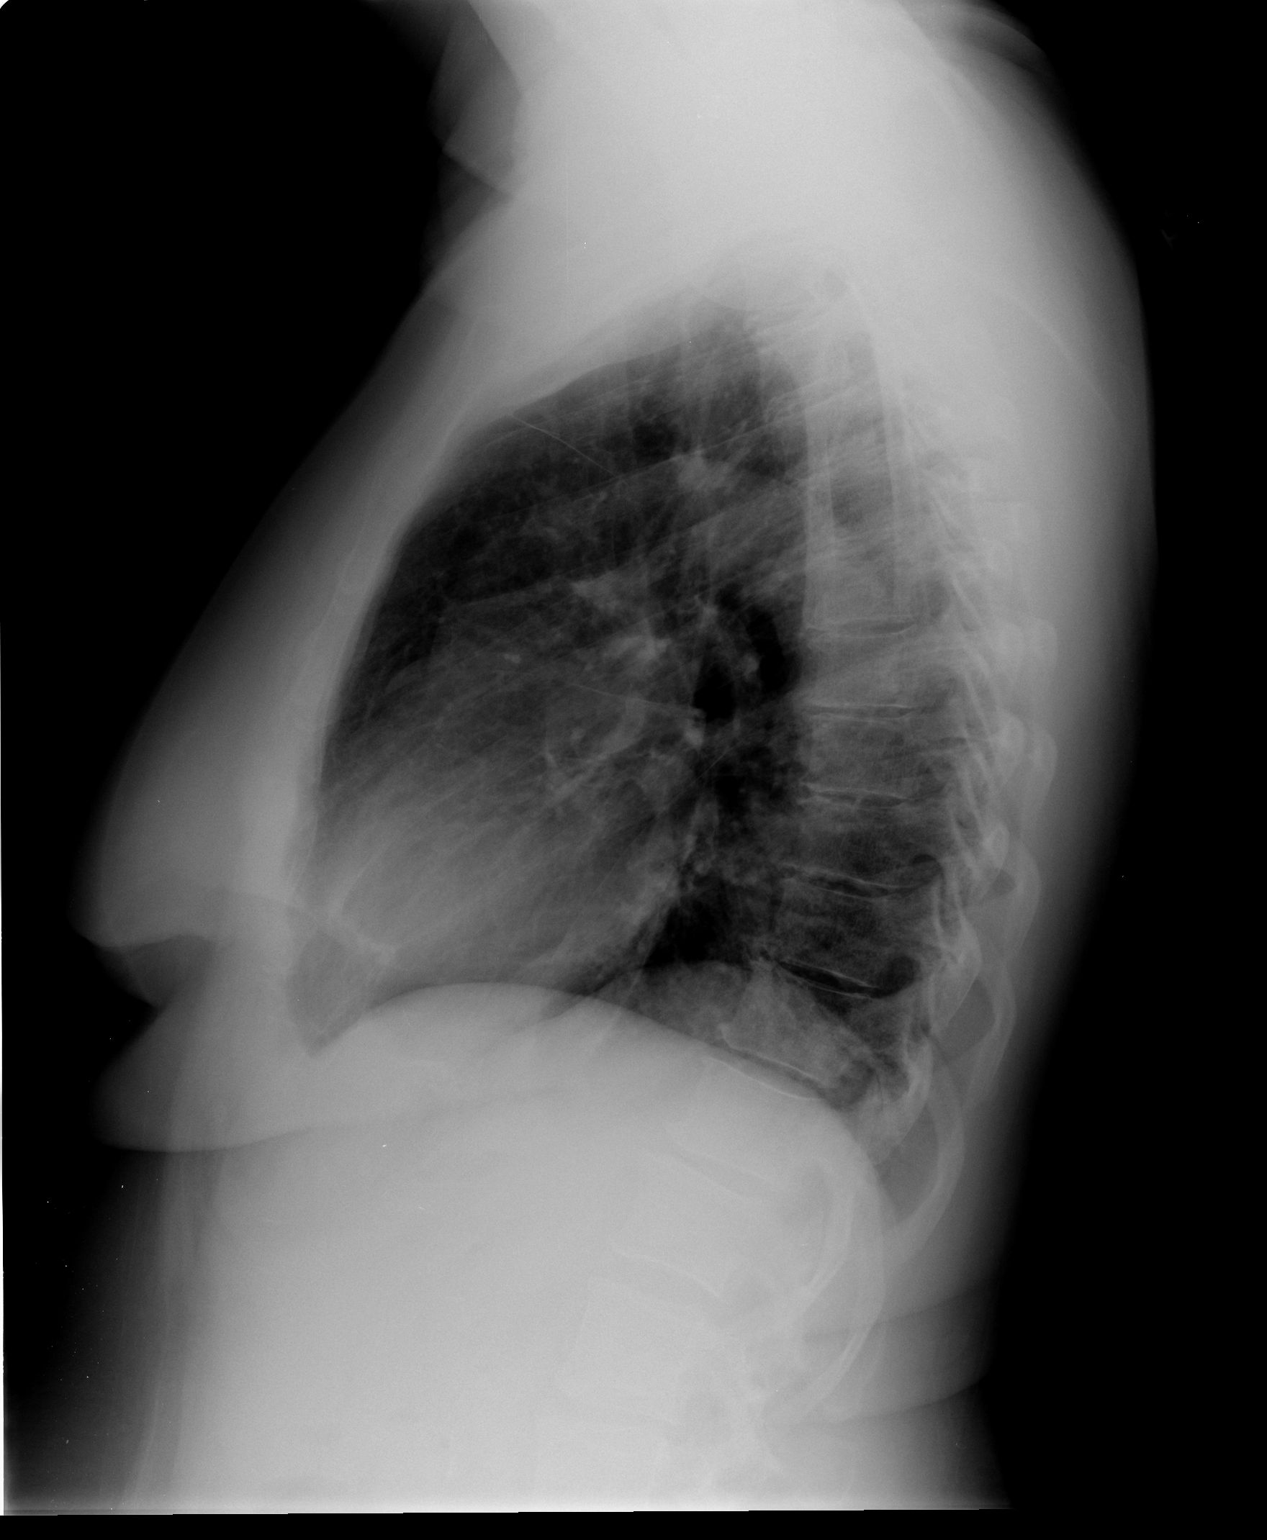

[2 of 2 positions shown; findings below may reference images not displayed]

FINDINGS: A stable degree of mild cardiomegaly is identified.
Ectasia of the thoracic aorta is unchanged.  The mediastinal
contour is otherwise within normal limits.

The lung fields appear clear with no signs of focal infiltrate or
congestive failure. No pleural fluid is seen.  Mild central
peribronchial thickening is stable.

Bony structures demonstrate degenerative change of the lower
cervical spine and are otherwise intact.
IMPRESSION: Stable cardiopulmonary appearance with no worrisome focal or acute
abnormality seen

## 2013-05-30 ENCOUNTER — Other Ambulatory Visit: Payer: Self-pay

## 2013-05-30 DIAGNOSIS — Z1231 Encounter for screening mammogram for malignant neoplasm of breast: Secondary | ICD-10-CM

## 2013-10-17 ENCOUNTER — Ambulatory Visit
Admission: RE | Admit: 2013-10-17 | Discharge: 2013-10-17 | Disposition: A | Discharge status: Home / Self Care | Payer: Medicare HMO | Source: Ambulatory Visit

## 2013-10-17 DIAGNOSIS — Z1231 Encounter for screening mammogram for malignant neoplasm of breast: Secondary | ICD-10-CM

## 2014-08-08 ENCOUNTER — Other Ambulatory Visit: Payer: Self-pay

## 2014-08-08 DIAGNOSIS — Z1231 Encounter for screening mammogram for malignant neoplasm of breast: Secondary | ICD-10-CM

## 2014-10-30 ENCOUNTER — Ambulatory Visit
Admission: RE | Admit: 2014-10-30 | Discharge: 2014-10-30 | Disposition: A | Discharge status: Home / Self Care | Payer: PPO | Source: Ambulatory Visit

## 2014-10-30 DIAGNOSIS — Z1231 Encounter for screening mammogram for malignant neoplasm of breast: Secondary | ICD-10-CM

## 2015-07-15 DIAGNOSIS — Z0000 Encounter for general adult medical examination without abnormal findings: Secondary | ICD-10-CM | POA: Diagnosis not present

## 2015-07-15 DIAGNOSIS — N183 Chronic kidney disease, stage 3 (moderate): Secondary | ICD-10-CM | POA: Diagnosis not present

## 2015-07-15 DIAGNOSIS — R7309 Other abnormal glucose: Secondary | ICD-10-CM | POA: Diagnosis not present

## 2015-07-15 DIAGNOSIS — E785 Hyperlipidemia, unspecified: Secondary | ICD-10-CM | POA: Diagnosis not present

## 2015-07-15 DIAGNOSIS — D649 Anemia, unspecified: Secondary | ICD-10-CM | POA: Diagnosis not present

## 2015-07-16 DIAGNOSIS — H31009 Unspecified chorioretinal scars, unspecified eye: Secondary | ICD-10-CM | POA: Diagnosis not present

## 2015-07-16 DIAGNOSIS — H35341 Macular cyst, hole, or pseudohole, right eye: Secondary | ICD-10-CM | POA: Diagnosis not present

## 2015-07-16 DIAGNOSIS — H35342 Macular cyst, hole, or pseudohole, left eye: Secondary | ICD-10-CM | POA: Diagnosis not present

## 2015-07-16 DIAGNOSIS — Z8669 Personal history of other diseases of the nervous system and sense organs: Secondary | ICD-10-CM | POA: Diagnosis not present

## 2015-07-23 DIAGNOSIS — E2839 Other primary ovarian failure: Secondary | ICD-10-CM | POA: Diagnosis not present

## 2015-07-23 DIAGNOSIS — E785 Hyperlipidemia, unspecified: Secondary | ICD-10-CM | POA: Diagnosis not present

## 2015-07-23 DIAGNOSIS — N183 Chronic kidney disease, stage 3 (moderate): Secondary | ICD-10-CM | POA: Diagnosis not present

## 2015-07-23 DIAGNOSIS — R7301 Impaired fasting glucose: Secondary | ICD-10-CM | POA: Diagnosis not present

## 2015-07-23 DIAGNOSIS — M5441 Lumbago with sciatica, right side: Secondary | ICD-10-CM | POA: Diagnosis not present

## 2015-07-23 DIAGNOSIS — D649 Anemia, unspecified: Secondary | ICD-10-CM | POA: Diagnosis not present

## 2015-07-23 DIAGNOSIS — Z853 Personal history of malignant neoplasm of breast: Secondary | ICD-10-CM | POA: Diagnosis not present

## 2015-07-23 DIAGNOSIS — I10 Essential (primary) hypertension: Secondary | ICD-10-CM | POA: Diagnosis not present

## 2015-07-23 DIAGNOSIS — F419 Anxiety disorder, unspecified: Secondary | ICD-10-CM | POA: Diagnosis not present

## 2015-08-27 DIAGNOSIS — Z780 Asymptomatic menopausal state: Secondary | ICD-10-CM | POA: Diagnosis not present

## 2015-08-28 DIAGNOSIS — N183 Chronic kidney disease, stage 3 (moderate): Secondary | ICD-10-CM | POA: Diagnosis not present

## 2015-08-28 DIAGNOSIS — I129 Hypertensive chronic kidney disease with stage 1 through stage 4 chronic kidney disease, or unspecified chronic kidney disease: Secondary | ICD-10-CM | POA: Diagnosis not present

## 2015-09-04 DIAGNOSIS — N183 Chronic kidney disease, stage 3 (moderate): Secondary | ICD-10-CM | POA: Diagnosis not present

## 2015-09-04 DIAGNOSIS — I129 Hypertensive chronic kidney disease with stage 1 through stage 4 chronic kidney disease, or unspecified chronic kidney disease: Secondary | ICD-10-CM | POA: Diagnosis not present

## 2015-10-23 ENCOUNTER — Other Ambulatory Visit: Payer: Self-pay | Admitting: Family Medicine

## 2015-10-23 DIAGNOSIS — Z1231 Encounter for screening mammogram for malignant neoplasm of breast: Secondary | ICD-10-CM

## 2015-11-05 ENCOUNTER — Ambulatory Visit
Admission: RE | Admit: 2015-11-05 | Discharge: 2015-11-05 | Disposition: A | Discharge status: Home / Self Care | Payer: PPO | Source: Ambulatory Visit | Attending: Family Medicine | Admitting: Family Medicine

## 2015-11-05 DIAGNOSIS — Z1231 Encounter for screening mammogram for malignant neoplasm of breast: Secondary | ICD-10-CM

## 2015-11-14 DIAGNOSIS — J029 Acute pharyngitis, unspecified: Secondary | ICD-10-CM | POA: Diagnosis not present

## 2015-11-14 DIAGNOSIS — J019 Acute sinusitis, unspecified: Secondary | ICD-10-CM | POA: Diagnosis not present

## 2015-11-14 DIAGNOSIS — R51 Headache: Secondary | ICD-10-CM | POA: Diagnosis not present

## 2016-01-21 DIAGNOSIS — Z23 Encounter for immunization: Secondary | ICD-10-CM | POA: Diagnosis not present

## 2016-02-25 DIAGNOSIS — H35342 Macular cyst, hole, or pseudohole, left eye: Secondary | ICD-10-CM | POA: Diagnosis not present

## 2016-02-25 DIAGNOSIS — H26492 Other secondary cataract, left eye: Secondary | ICD-10-CM | POA: Diagnosis not present

## 2016-02-25 DIAGNOSIS — Z961 Presence of intraocular lens: Secondary | ICD-10-CM | POA: Diagnosis not present

## 2016-02-25 DIAGNOSIS — H31093 Other chorioretinal scars, bilateral: Secondary | ICD-10-CM | POA: Diagnosis not present

## 2016-03-03 DIAGNOSIS — N183 Chronic kidney disease, stage 3 (moderate): Secondary | ICD-10-CM | POA: Diagnosis not present

## 2016-03-09 DIAGNOSIS — N183 Chronic kidney disease, stage 3 (moderate): Secondary | ICD-10-CM | POA: Diagnosis not present

## 2016-03-09 DIAGNOSIS — I129 Hypertensive chronic kidney disease with stage 1 through stage 4 chronic kidney disease, or unspecified chronic kidney disease: Secondary | ICD-10-CM | POA: Diagnosis not present

## 2016-06-22 ENCOUNTER — Other Ambulatory Visit: Payer: Self-pay | Admitting: Family

## 2016-06-22 ENCOUNTER — Other Ambulatory Visit: Payer: Self-pay | Admitting: Family Medicine

## 2016-06-22 DIAGNOSIS — Z1231 Encounter for screening mammogram for malignant neoplasm of breast: Secondary | ICD-10-CM

## 2016-07-14 DIAGNOSIS — H31009 Unspecified chorioretinal scars, unspecified eye: Secondary | ICD-10-CM | POA: Diagnosis not present

## 2016-07-14 DIAGNOSIS — Z961 Presence of intraocular lens: Secondary | ICD-10-CM | POA: Diagnosis not present

## 2016-07-14 DIAGNOSIS — H35341 Macular cyst, hole, or pseudohole, right eye: Secondary | ICD-10-CM | POA: Diagnosis not present

## 2016-07-14 DIAGNOSIS — H35342 Macular cyst, hole, or pseudohole, left eye: Secondary | ICD-10-CM | POA: Diagnosis not present

## 2016-07-21 DIAGNOSIS — R7303 Prediabetes: Secondary | ICD-10-CM | POA: Diagnosis not present

## 2016-07-21 DIAGNOSIS — Z79899 Other long term (current) drug therapy: Secondary | ICD-10-CM | POA: Diagnosis not present

## 2016-07-21 DIAGNOSIS — E785 Hyperlipidemia, unspecified: Secondary | ICD-10-CM | POA: Diagnosis not present

## 2016-07-21 DIAGNOSIS — D638 Anemia in other chronic diseases classified elsewhere: Secondary | ICD-10-CM | POA: Diagnosis not present

## 2016-07-28 DIAGNOSIS — Z0001 Encounter for general adult medical examination with abnormal findings: Secondary | ICD-10-CM | POA: Diagnosis not present

## 2016-07-28 DIAGNOSIS — F419 Anxiety disorder, unspecified: Secondary | ICD-10-CM | POA: Diagnosis not present

## 2016-07-28 DIAGNOSIS — D573 Sickle-cell trait: Secondary | ICD-10-CM | POA: Diagnosis not present

## 2016-07-28 DIAGNOSIS — N183 Chronic kidney disease, stage 3 (moderate): Secondary | ICD-10-CM | POA: Diagnosis not present

## 2016-07-28 DIAGNOSIS — I10 Essential (primary) hypertension: Secondary | ICD-10-CM | POA: Diagnosis not present

## 2016-07-28 DIAGNOSIS — E784 Other hyperlipidemia: Secondary | ICD-10-CM | POA: Diagnosis not present

## 2016-07-28 DIAGNOSIS — L259 Unspecified contact dermatitis, unspecified cause: Secondary | ICD-10-CM | POA: Diagnosis not present

## 2016-07-28 DIAGNOSIS — D638 Anemia in other chronic diseases classified elsewhere: Secondary | ICD-10-CM | POA: Diagnosis not present

## 2016-09-15 DIAGNOSIS — N183 Chronic kidney disease, stage 3 (moderate): Secondary | ICD-10-CM | POA: Diagnosis not present

## 2016-09-23 DIAGNOSIS — N183 Chronic kidney disease, stage 3 (moderate): Secondary | ICD-10-CM | POA: Diagnosis not present

## 2016-09-23 DIAGNOSIS — I129 Hypertensive chronic kidney disease with stage 1 through stage 4 chronic kidney disease, or unspecified chronic kidney disease: Secondary | ICD-10-CM | POA: Diagnosis not present

## 2016-10-15 DIAGNOSIS — R7303 Prediabetes: Secondary | ICD-10-CM | POA: Diagnosis not present

## 2016-10-15 DIAGNOSIS — N183 Chronic kidney disease, stage 3 (moderate): Secondary | ICD-10-CM | POA: Diagnosis not present

## 2016-10-15 DIAGNOSIS — E784 Other hyperlipidemia: Secondary | ICD-10-CM | POA: Diagnosis not present

## 2016-10-15 DIAGNOSIS — B354 Tinea corporis: Secondary | ICD-10-CM | POA: Diagnosis not present

## 2016-10-15 DIAGNOSIS — F419 Anxiety disorder, unspecified: Secondary | ICD-10-CM | POA: Diagnosis not present

## 2016-10-15 DIAGNOSIS — D638 Anemia in other chronic diseases classified elsewhere: Secondary | ICD-10-CM | POA: Diagnosis not present

## 2016-10-15 DIAGNOSIS — I10 Essential (primary) hypertension: Secondary | ICD-10-CM | POA: Diagnosis not present

## 2016-11-05 ENCOUNTER — Ambulatory Visit
Admission: RE | Admit: 2016-11-05 | Discharge: 2016-11-05 | Disposition: A | Discharge status: Home / Self Care | Payer: PPO | Source: Ambulatory Visit | Attending: Family | Admitting: Family

## 2016-11-05 DIAGNOSIS — Z1231 Encounter for screening mammogram for malignant neoplasm of breast: Secondary | ICD-10-CM

## 2016-11-10 ENCOUNTER — Ambulatory Visit: Payer: PPO

## 2017-03-22 DIAGNOSIS — N183 Chronic kidney disease, stage 3 (moderate): Secondary | ICD-10-CM | POA: Diagnosis not present

## 2017-03-31 DIAGNOSIS — I129 Hypertensive chronic kidney disease with stage 1 through stage 4 chronic kidney disease, or unspecified chronic kidney disease: Secondary | ICD-10-CM | POA: Diagnosis not present

## 2017-03-31 DIAGNOSIS — N183 Chronic kidney disease, stage 3 (moderate): Secondary | ICD-10-CM | POA: Diagnosis not present

## 2017-04-19 DIAGNOSIS — E785 Hyperlipidemia, unspecified: Secondary | ICD-10-CM | POA: Diagnosis not present

## 2017-04-19 DIAGNOSIS — I10 Essential (primary) hypertension: Secondary | ICD-10-CM | POA: Diagnosis not present

## 2017-04-19 DIAGNOSIS — R7303 Prediabetes: Secondary | ICD-10-CM | POA: Diagnosis not present

## 2017-04-19 DIAGNOSIS — G59 Mononeuropathy in diseases classified elsewhere: Secondary | ICD-10-CM | POA: Diagnosis not present

## 2017-04-19 DIAGNOSIS — N6452 Nipple discharge: Secondary | ICD-10-CM | POA: Diagnosis not present

## 2017-04-19 DIAGNOSIS — M7652 Patellar tendinitis, left knee: Secondary | ICD-10-CM | POA: Diagnosis not present

## 2017-04-19 DIAGNOSIS — F419 Anxiety disorder, unspecified: Secondary | ICD-10-CM | POA: Diagnosis not present

## 2017-04-19 DIAGNOSIS — N183 Chronic kidney disease, stage 3 (moderate): Secondary | ICD-10-CM | POA: Diagnosis not present

## 2017-04-20 HISTORY — PX: BREAST EXCISIONAL BIOPSY: SUR124

## 2017-04-22 ENCOUNTER — Other Ambulatory Visit: Payer: Self-pay | Admitting: Nurse Practitioner

## 2017-04-22 DIAGNOSIS — N6452 Nipple discharge: Secondary | ICD-10-CM

## 2017-04-27 ENCOUNTER — Ambulatory Visit
Admission: RE | Admit: 2017-04-27 | Discharge: 2017-04-27 | Disposition: A | Discharge status: Home / Self Care | Payer: PPO | Source: Ambulatory Visit | Attending: Nurse Practitioner | Admitting: Nurse Practitioner

## 2017-04-27 ENCOUNTER — Other Ambulatory Visit: Payer: Self-pay | Admitting: Nurse Practitioner

## 2017-04-27 DIAGNOSIS — N6320 Unspecified lump in the left breast, unspecified quadrant: Secondary | ICD-10-CM

## 2017-04-27 DIAGNOSIS — N6489 Other specified disorders of breast: Secondary | ICD-10-CM | POA: Diagnosis not present

## 2017-04-27 DIAGNOSIS — N6452 Nipple discharge: Secondary | ICD-10-CM

## 2017-04-27 DIAGNOSIS — R599 Enlarged lymph nodes, unspecified: Secondary | ICD-10-CM

## 2017-04-27 DIAGNOSIS — R922 Inconclusive mammogram: Secondary | ICD-10-CM | POA: Diagnosis not present

## 2017-04-30 ENCOUNTER — Ambulatory Visit
Admission: RE | Admit: 2017-04-30 | Discharge: 2017-04-30 | Disposition: A | Discharge status: Home / Self Care | Payer: PPO | Source: Ambulatory Visit | Attending: Nurse Practitioner | Admitting: Nurse Practitioner

## 2017-04-30 DIAGNOSIS — N6320 Unspecified lump in the left breast, unspecified quadrant: Secondary | ICD-10-CM

## 2017-04-30 DIAGNOSIS — N649 Disorder of breast, unspecified: Secondary | ICD-10-CM | POA: Diagnosis not present

## 2017-04-30 DIAGNOSIS — R599 Enlarged lymph nodes, unspecified: Secondary | ICD-10-CM

## 2017-04-30 DIAGNOSIS — N6342 Unspecified lump in left breast, subareolar: Secondary | ICD-10-CM | POA: Diagnosis not present

## 2017-05-25 ENCOUNTER — Other Ambulatory Visit: Payer: Self-pay

## 2017-05-25 ENCOUNTER — Encounter (HOSPITAL_BASED_OUTPATIENT_CLINIC_OR_DEPARTMENT_OTHER): Payer: Self-pay | Admitting: *Deleted

## 2017-05-25 ENCOUNTER — Other Ambulatory Visit: Payer: Self-pay | Admitting: General Surgery

## 2017-05-25 DIAGNOSIS — R928 Other abnormal and inconclusive findings on diagnostic imaging of breast: Secondary | ICD-10-CM | POA: Diagnosis not present

## 2017-05-25 DIAGNOSIS — N6452 Nipple discharge: Secondary | ICD-10-CM

## 2017-05-26 ENCOUNTER — Encounter (HOSPITAL_BASED_OUTPATIENT_CLINIC_OR_DEPARTMENT_OTHER)
Admission: RE | Admit: 2017-05-26 | Discharge: 2017-05-26 | Disposition: A | Discharge status: Home / Self Care | Payer: PPO | Source: Ambulatory Visit | Attending: General Surgery | Admitting: General Surgery

## 2017-05-26 ENCOUNTER — Other Ambulatory Visit: Payer: Self-pay

## 2017-05-26 DIAGNOSIS — Z01810 Encounter for preprocedural cardiovascular examination: Secondary | ICD-10-CM | POA: Insufficient documentation

## 2017-05-26 DIAGNOSIS — I10 Essential (primary) hypertension: Secondary | ICD-10-CM | POA: Diagnosis not present

## 2017-05-26 DIAGNOSIS — R001 Bradycardia, unspecified: Secondary | ICD-10-CM | POA: Diagnosis not present

## 2017-05-27 NOTE — Progress Notes (Signed)
Pt came for BMET and she has no peripheral veins visible Conferred with Dr Lissa Hoard it was decided to wait till DOS and use  Ultrasound to get IV draw BMET PCP sent Labs from 12/31 which were normal

## 2017-05-31 ENCOUNTER — Ambulatory Visit
Admission: RE | Admit: 2017-05-31 | Discharge: 2017-05-31 | Disposition: A | Discharge status: Home / Self Care | Payer: PPO | Source: Ambulatory Visit | Attending: General Surgery | Admitting: General Surgery

## 2017-05-31 DIAGNOSIS — D242 Benign neoplasm of left breast: Secondary | ICD-10-CM | POA: Diagnosis not present

## 2017-05-31 DIAGNOSIS — N6452 Nipple discharge: Secondary | ICD-10-CM

## 2017-05-31 DIAGNOSIS — R928 Other abnormal and inconclusive findings on diagnostic imaging of breast: Secondary | ICD-10-CM

## 2017-05-31 NOTE — Anesthesia Preprocedure Evaluation (Addendum)
Anesthesia Evaluation  Patient identified by MRN, date of birth, ID band Patient awake    Reviewed: Allergy & Precautions, NPO status , Patient's Chart, lab work & pertinent test results  Airway Mallampati: II  TM Distance: >3 FB Neck ROM: Full    Dental  (+) Dental Advisory Given   Pulmonary asthma , former smoker,    breath sounds clear to auscultation       Cardiovascular hypertension, Pt. on medications and Pt. on home beta blockers  Rhythm:Regular Rate:Normal     Neuro/Psych negative neurological ROS     GI/Hepatic negative GI ROS, Neg liver ROS,   Endo/Other  negative endocrine ROS  Renal/GU negative Renal ROS     Musculoskeletal   Abdominal   Peds  Hematology negative hematology ROS (+)   Anesthesia Other Findings   Reproductive/Obstetrics                            Anesthesia Physical Anesthesia Plan  ASA: II  Anesthesia Plan: General   Post-op Pain Management:    Induction: Intravenous  PONV Risk Score and Plan: 3 and Ondansetron, Dexamethasone and Treatment may vary due to age or medical condition  Airway Management Planned: LMA  Additional Equipment:   Intra-op Plan:   Post-operative Plan: Extubation in OR  Informed Consent: I have reviewed the patients History and Physical, chart, labs and discussed the procedure including the risks, benefits and alternatives for the proposed anesthesia with the patient or authorized representative who has indicated his/her understanding and acceptance.   Dental advisory given  Plan Discussed with: CRNA  Anesthesia Plan Comments:        Anesthesia Quick Evaluation

## 2017-05-31 NOTE — H&P (Signed)
Sylvia Simpson Found Documented: 05/25/2017 9:10 AM Location: [facility] Patient #: [id number] DOB: [birth year] Married / Language: English / Race: Black or African American Female   History of Present Illness Sylvia Klein MD; 05/25/2017 9:42 AM) The patient is a 72 year old female who presents with a complaint of nipple discharge. Patient is a 72 year old female who is referred by Dr. Radford Pax for an abnormal left mammogram and left nipple discharge. The patient has a history of right breast cancer 25 years ago. When she developed spontaneous left nipple discharge, she presented for diagnostic imaging. A 1 cm mass was seen in the retroareolar region in the upper inner quadrant. A core needle biopsy was performed which was consistent with a papillary lesion. She presents for excision. She has a little bit of soreness in her left breast since the biopsy. She denies pain prior to this. She has not been able to palpate any mass.  Her right breast cancer was treated at [facility] in [place]. She had a lumpectomy, radiation, and chemotherapy.  Left dx mammo/us 04/27/2017  ACR Breast Density Category c: The breast tissue is heterogeneously dense, which may obscure small masses.  FINDINGS: Examination demonstrates an oval mostly circumscribed 1 cm mass over the slightly inner midportion of the retroareolar region. Remainder of the exam is notable only for slight increased size of a couple left axillary lymph nodes.  Mammographic images were processed with CAD.  On physical exam, I am able to elicit clear discharge from a single central duct.  Targeted ultrasound is performed, showing an oval hypoechoic intraductal mass at the 10 o'clock position of the right retroareolar region with predominately circumscribed borders but mild focal border irregularity. There is moderate increased internal vascularity of this mass as this mass measures 7 x 8 x 11 mm.  This likely represents a papilloma, although malignancy is possible.  Ultrasound of the left axilla demonstrates a couple adjacent lymph nodes with mild cortical thickening measuring up to 5 mm in thickness.  IMPRESSION: Indeterminate intraductal mass over the 10 o'clock position of the right retroareolar region measuring 7 x 8 x 11 mm in this patient with spontaneous clear nipple discharge. This likely represents a papilloma, although malignancy is possible. Abnormal left axillary lymph node.  RECOMMENDATION: Recommend ultrasound-guided core needle biopsy of the suspicious retroareolar mass as well as left axillary lymph node.  I have discussed the findings and recommendations with the patient. Results were also provided in writing at the conclusion of the visit. If applicable, a reminder letter will be sent to the patient regarding the next appointment.  BI-RADS CATEGORY 4: Suspicious.  Breast bx 04/30/2017 Diagnosis Breast, left, needle core biopsy, upper inner retroareolar - PAPILLARY LESION - SEE COMMENT Microscopic Comment The biopsy material is of an encapsulated papillary lesion. The capsule has infiltrative appearing glands, however, by immunohistochemistry the glands maintain their myoepithelial layer (SMM, p63 and calponin) supporting the absence of invasion. There are foci of low grade cytologic monotony. Overall, this is favored to be an atypical papilloma.   Past Surgical History (April Staton, CMA; 05/25/2017 9:10 AM) Breast Biopsy  Left. Breast Mass; Local Excision  Right. Cataract Surgery  Bilateral. Colon Polyp Removal - Colonoscopy  Gallbladder Surgery - Open  Hysterectomy (not due to cancer) - Partial   Diagnostic Studies History (April Staton, CMA; 05/25/2017 9:10 AM) Colonoscopy  1-5 years ago Mammogram  within last year Pap Smear  1-5 years ago  Allergies (April Staton, [place]; 05/25/2017  9:11 AM) Latex   Medication History (April Staton,  CMA; 05/25/2017 9:19 AM) Metoprolol Succinate ER (25MG  Tablet ER 24HR, Oral) Active. Enalapril Maleate (20MG  Tablet, Oral) Active. HydroCHLOROthiazide (25MG  Tablet, Oral) Active. GlycoLax (Oral) Active. Fish Oil (1000MG  Capsule, Oral) Active. Multivitamin Adult (Oral) Active. Acidophilus (Oral) Specific strength unknown - Active. FiberCon (Oral) Specific strength unknown - Active. Aspirin (325MG  Tablet, Oral) Active. Garlic (1000MG  Capsule, Oral) Active. Magnesium (Oral) Specific strength unknown - Active. Vitamin C (500MG  Capsule, Oral) Active. Lecithin (400MG  Capsule, Oral) Active. Vitamin A (8000UNIT Capsule, Oral) Active. Co Q10 (100MG  Capsule, Oral) Active. Green Tea (Oral) Specific strength unknown - Active. Pravastatin Sodium (20MG  Tablet, Oral) Active. LORazepam (1MG  Tablet, Oral) Active. Iron (65MG  Tablet, Oral) Active. TraMADol HCl (50MG  Tablet, Oral) Active. Vitamin D (1000UNIT Tablet, Oral) Active. Cinnamon (Oral) Specific strength unknown - Active. Citrus Bioflavonoids (Oral) Specific strength unknown - Active. Medications Reconciled  Social History (April Staton, CMA; 05/25/2017 9:10 AM) Alcohol use  Occasional alcohol use. Caffeine use  Tea. No drug use  Tobacco use  Former smoker.  Family History (April Staton, [place]; 05/25/2017 9:10 AM) Alcohol Abuse  Father. Breast Cancer  Family Members In General. Heart Disease  Mother. Heart disease in female family member before age 58   Pregnancy / Birth History (April Staton, [place]; 05/25/2017 9:10 AM) Age at menarche  41 years. Age of menopause  3-50 Contraceptive History  Intrauterine device, Oral contraceptives. Gravida  2 Irregular periods  Maternal age  26-20 Para  2  Other Problems (April Staton, CMA; 05/25/2017 9:10 AM) Back Pain  Breast Cancer  Cholelithiasis  Hemorrhoids  High blood pressure  Transfusion history     Review of Systems (April Staton CMA; 05/25/2017  9:10 AM) General Not Present- Appetite Loss, Chills, Fatigue, Fever, Night Sweats, Weight Gain and Weight Loss. Skin Not Present- Change in Wart/Mole, Dryness, Hives, Jaundice, New Lesions, Non-Healing Wounds, Rash and Ulcer. HEENT Present- Wears glasses/contact lenses. Not Present- Earache, Hearing Loss, Hoarseness, Nose Bleed, Oral Ulcers, Ringing in the Ears, Seasonal Allergies, Sinus Pain, Sore Throat, Visual Disturbances and Yellow Eyes. Breast Present- Nipple Discharge. Not Present- Breast Mass, Breast Pain and Skin Changes. Cardiovascular Not Present- Chest Pain, Difficulty Breathing Lying Down, Leg Cramps, Palpitations, Rapid Heart Rate, Shortness of Breath and Swelling of Extremities. Gastrointestinal Present- Hemorrhoids. Not Present- Abdominal Pain, Bloating, Bloody Stool, Change in Bowel Habits, Chronic diarrhea, Constipation, Difficulty Swallowing, Excessive gas, Gets full quickly at meals, Indigestion, Nausea, Rectal Pain and Vomiting. Female Genitourinary Not Present- Frequency, Nocturia, Painful Urination, Pelvic Pain and Urgency. Musculoskeletal Present- Back Pain. Not Present- Joint Pain, Joint Stiffness, Muscle Pain, Muscle Weakness and Swelling of Extremities. Neurological Present- Decreased Memory. Not Present- Fainting, Headaches, Numbness, Seizures, Tingling, Tremor, Trouble walking and Weakness. Psychiatric Not Present- Anxiety, Bipolar, Change in Sleep Pattern, Depression, Fearful and Frequent crying. Endocrine Present- Hair Changes. Not Present- Cold Intolerance, Excessive Hunger, Heat Intolerance, Hot flashes and New Diabetes. Hematology Not Present- Blood Thinners, Easy Bruising, Excessive bleeding, Gland problems, HIV and Persistent Infections.  Vitals (April Staton CMA; 05/25/2017 9:20 AM) 05/25/2017 9:19 AM Weight: 179.5 lb Height: 64in Body Surface Area: 1.87 m Body Mass Index: 30.81 kg/m  Temp.: 98.70F(Oral)  P.OX: 95% (Room air) BP: 160/90 (Sitting, Left  Arm, Standard)       Physical Exam Sylvia Klein MD; 05/25/2017 9:43 AM) General Mental Status-Alert. General Appearance-Consistent with stated age. Hydration-Well hydrated. Voice-Normal.  Head and Neck Head-normocephalic, atraumatic with no lesions or palpable masses. Trachea-midline. Thyroid Gland Characteristics -  normal size and consistency.  Eye Eyeball - Bilateral-Extraocular movements intact. Sclera/Conjunctiva - Bilateral-No scleral icterus.  Chest and Lung Exam Chest and lung exam reveals -quiet, even and easy respiratory effort with no use of accessory muscles and on auscultation, normal breath sounds, no adventitious sounds and normal vocal resonance. Inspection Chest Wall - Normal. Back - normal.  Breast Note: Right breast with scarring inferiorly. nipple deviation in this direction. Thickening at the prior lumpectomy site. No palpable masses or LAD. Left breast with expressible clear nipple discharge. No palpable mass. breasts with mild ptosis. no LAD, no nipple retraction or skin dimpling.   Cardiovascular Cardiovascular examination reveals -normal heart sounds, regular rate and rhythm with no murmurs and normal pedal pulses bilaterally.  Abdomen Inspection Inspection of the abdomen reveals - No Hernias. Palpation/Percussion Palpation and Percussion of the abdomen reveal - Soft, Non Tender, No Rebound tenderness, No Rigidity (guarding) and No hepatosplenomegaly. Auscultation Auscultation of the abdomen reveals - Bowel sounds normal. Note: right abdominal incision.   Neurologic Neurologic evaluation reveals -alert and oriented x 3 with no impairment of recent or remote memory. Mental Status-Normal.  Musculoskeletal Global Assessment -Note: no gross deformities.  Normal Exam - Left-Upper Extremity Strength Normal and Lower Extremity Strength Normal. Normal Exam - Right-Upper Extremity Strength Normal and Lower Extremity  Strength Normal.  Lymphatic Head & Neck  General Head & Neck Lymphatics: Bilateral - Description - Normal. Axillary  General Axillary Region: Bilateral - Description - Normal. Tenderness - Non Tender. Femoral & Inguinal  Generalized Femoral & Inguinal Lymphatics: Bilateral - Description - No Generalized lymphadenopathy.    Assessment & Plan Sylvia Klein MD; 05/25/2017 9:44 AM) DISCHARGE FROM LEFT NIPPLE (N64.52) Impression: Clear nipple discharge c/w papilloma. Discussed that sometimes the cavity from the lumpectomy can communicate with nipple in these cases and occasionally nipple discharge will persist for a while post op. ABNORMAL MAMMOGRAM OF LEFT BREAST (R92.8) Impression: Reviewed rationale for excision.  The surgical procedure was described to the patient. I discussed the incision type and location and that we would need radiology involved on with a wire or seed marker and/or sentinel node.  The risks and benefits of the procedure were described to the patient and she wishes to proceed.  We discussed the risks bleeding, infection, damage to other structures, need for further procedures/surgeries. We discussed the risk of seroma. The patient was advised if the area in the breast in cancer, we may need to go back to surgery for additional tissue to obtain negative margins or for a lymph node biopsy. The patient was advised that these are the most common complications, but that others can occur as well. They were advised against taking aspirin or other anti-inflammatory agents/blood thinners the week before surgery. Current Plans Pt Education - [facility] Breast Biopsy HCI: discussed with patient and provided information. Schedule for Surgery   Signed by Sylvia Klein, MD (05/25/2017 9:46 AM)

## 2017-06-01 ENCOUNTER — Ambulatory Visit (HOSPITAL_BASED_OUTPATIENT_CLINIC_OR_DEPARTMENT_OTHER): Payer: PPO | Admitting: Anesthesiology

## 2017-06-01 ENCOUNTER — Other Ambulatory Visit: Payer: Self-pay

## 2017-06-01 ENCOUNTER — Ambulatory Visit
Admission: RE | Admit: 2017-06-01 | Discharge: 2017-06-01 | Disposition: A | Discharge status: Home / Self Care | Payer: PPO | Source: Ambulatory Visit | Attending: General Surgery | Admitting: General Surgery

## 2017-06-01 ENCOUNTER — Encounter (HOSPITAL_BASED_OUTPATIENT_CLINIC_OR_DEPARTMENT_OTHER): Payer: Self-pay | Admitting: *Deleted

## 2017-06-01 ENCOUNTER — Encounter (HOSPITAL_BASED_OUTPATIENT_CLINIC_OR_DEPARTMENT_OTHER)
Admission: RE | Disposition: A | Discharge status: Home / Self Care | Payer: Self-pay | Source: Ambulatory Visit | Attending: General Surgery

## 2017-06-01 ENCOUNTER — Ambulatory Visit (HOSPITAL_BASED_OUTPATIENT_CLINIC_OR_DEPARTMENT_OTHER)
Admission: RE | Admit: 2017-06-01 | Discharge: 2017-06-01 | Disposition: A | Discharge status: Home / Self Care | Payer: PPO | Source: Ambulatory Visit | Attending: General Surgery | Admitting: General Surgery

## 2017-06-01 DIAGNOSIS — N6452 Nipple discharge: Secondary | ICD-10-CM

## 2017-06-01 DIAGNOSIS — Z79899 Other long term (current) drug therapy: Secondary | ICD-10-CM | POA: Insufficient documentation

## 2017-06-01 DIAGNOSIS — J45909 Unspecified asthma, uncomplicated: Secondary | ICD-10-CM | POA: Diagnosis not present

## 2017-06-01 DIAGNOSIS — N6012 Diffuse cystic mastopathy of left breast: Secondary | ICD-10-CM | POA: Diagnosis not present

## 2017-06-01 DIAGNOSIS — N6032 Fibrosclerosis of left breast: Secondary | ICD-10-CM | POA: Diagnosis not present

## 2017-06-01 DIAGNOSIS — Z87891 Personal history of nicotine dependence: Secondary | ICD-10-CM | POA: Diagnosis not present

## 2017-06-01 DIAGNOSIS — R928 Other abnormal and inconclusive findings on diagnostic imaging of breast: Secondary | ICD-10-CM

## 2017-06-01 DIAGNOSIS — Z7982 Long term (current) use of aspirin: Secondary | ICD-10-CM | POA: Insufficient documentation

## 2017-06-01 DIAGNOSIS — I10 Essential (primary) hypertension: Secondary | ICD-10-CM | POA: Diagnosis not present

## 2017-06-01 DIAGNOSIS — D242 Benign neoplasm of left breast: Secondary | ICD-10-CM | POA: Insufficient documentation

## 2017-06-01 DIAGNOSIS — N6092 Unspecified benign mammary dysplasia of left breast: Secondary | ICD-10-CM | POA: Insufficient documentation

## 2017-06-01 DIAGNOSIS — Z91040 Latex allergy status: Secondary | ICD-10-CM | POA: Diagnosis not present

## 2017-06-01 HISTORY — DX: Unspecified asthma, uncomplicated: J45.909

## 2017-06-01 HISTORY — PX: RADIOACTIVE SEED GUIDED EXCISIONAL BREAST BIOPSY: SHX6490

## 2017-06-01 HISTORY — DX: Anxiety disorder, unspecified: F41.9

## 2017-06-01 HISTORY — DX: Essential (primary) hypertension: I10

## 2017-06-01 HISTORY — DX: Malignant (primary) neoplasm, unspecified: C80.1

## 2017-06-01 LAB — POCT I-STAT, CHEM 8
BUN: 21 mg/dL — AB (ref 6–20)
CALCIUM ION: 1.47 mmol/L — AB (ref 1.15–1.40)
Chloride: 105 mmol/L (ref 101–111)
Creatinine, Ser: 1.2 mg/dL — ABNORMAL HIGH (ref 0.44–1.00)
Glucose, Bld: 87 mg/dL (ref 65–99)
HCT: 36 % (ref 36.0–46.0)
HEMOGLOBIN: 12.2 g/dL (ref 12.0–15.0)
Potassium: 3.7 mmol/L (ref 3.5–5.1)
SODIUM: 143 mmol/L (ref 135–145)
TCO2: 25 mmol/L (ref 22–32)

## 2017-06-01 SURGERY — RADIOACTIVE SEED GUIDED BREAST BIOPSY
Anesthesia: General | Site: Breast | Laterality: Left

## 2017-06-01 MED ORDER — OXYCODONE HCL 5 MG PO TABS: 5.0000 mg | ORAL_TABLET | Freq: Four times a day (QID) | ORAL | 0 refills | Status: AC | PRN

## 2017-06-01 MED ORDER — CHLORHEXIDINE GLUCONATE CLOTH 2 % EX PADS: 6.0000 | MEDICATED_PAD | Freq: Once | CUTANEOUS | Status: DC

## 2017-06-01 MED ORDER — PROPOFOL 10 MG/ML IV BOLUS
INTRAVENOUS | Status: AC
Start: 2017-06-01 — End: 2017-06-01
  Filled 2017-06-01: qty 40

## 2017-06-01 MED ORDER — BUPIVACAINE-EPINEPHRINE 0.25% -1:200000 IJ SOLN
INTRAMUSCULAR | Status: AC
  Filled 2017-06-01: qty 2

## 2017-06-01 MED ORDER — LIDOCAINE 2% (20 MG/ML) 5 ML SYRINGE
INTRAMUSCULAR | Status: AC
  Filled 2017-06-01: qty 5

## 2017-06-01 MED ORDER — LIDOCAINE HCL (PF) 1 % IJ SOLN
INTRAMUSCULAR | Status: AC
  Filled 2017-06-01: qty 30

## 2017-06-01 MED ORDER — ONDANSETRON HCL 4 MG/2ML IJ SOLN
INTRAMUSCULAR | Status: DC | PRN
  Administered 2017-06-01: 4 mg via INTRAVENOUS

## 2017-06-01 MED ORDER — LACTATED RINGERS IV SOLN
INTRAVENOUS | Status: DC
  Administered 2017-06-01: 07:00:00 via INTRAVENOUS

## 2017-06-01 MED ORDER — ACETAMINOPHEN 500 MG PO TABS
1000.0000 mg | ORAL_TABLET | ORAL | Status: AC
  Administered 2017-06-01: 1000 mg via ORAL

## 2017-06-01 MED ORDER — SCOPOLAMINE 1 MG/3DAYS TD PT72: 1.0000 | MEDICATED_PATCH | Freq: Once | TRANSDERMAL | Status: DC | PRN

## 2017-06-01 MED ORDER — DEXAMETHASONE SODIUM PHOSPHATE 10 MG/ML IJ SOLN
INTRAMUSCULAR | Status: AC
  Filled 2017-06-01: qty 1

## 2017-06-01 MED ORDER — MIDAZOLAM HCL 2 MG/2ML IJ SOLN: 1.0000 mg | INTRAMUSCULAR | Status: DC | PRN

## 2017-06-01 MED ORDER — FENTANYL CITRATE (PF) 100 MCG/2ML IJ SOLN
50.0000 ug | INTRAMUSCULAR | Status: DC | PRN
  Administered 2017-06-01: 50 ug via INTRAVENOUS

## 2017-06-01 MED ORDER — LIDOCAINE HCL 1 % IJ SOLN
INTRAMUSCULAR | Status: DC | PRN
  Administered 2017-06-01: 20 mL via INTRAMUSCULAR

## 2017-06-01 MED ORDER — GABAPENTIN 300 MG PO CAPS
ORAL_CAPSULE | ORAL | Status: AC
  Filled 2017-06-01: qty 1

## 2017-06-01 MED ORDER — SODIUM CHLORIDE 0.9 % IJ SOLN
INTRAMUSCULAR | Status: AC
  Filled 2017-06-01: qty 10

## 2017-06-01 MED ORDER — LIDOCAINE HCL (CARDIAC) 20 MG/ML IV SOLN
INTRAVENOUS | Status: DC | PRN
  Administered 2017-06-01: 80 mg via INTRAVENOUS

## 2017-06-01 MED ORDER — DEXAMETHASONE SODIUM PHOSPHATE 10 MG/ML IJ SOLN
INTRAMUSCULAR | Status: DC | PRN
  Administered 2017-06-01: 10 mg via INTRAVENOUS

## 2017-06-01 MED ORDER — BUPIVACAINE-EPINEPHRINE (PF) 0.5% -1:200000 IJ SOLN
INTRAMUSCULAR | Status: AC
  Filled 2017-06-01: qty 30

## 2017-06-01 MED ORDER — ACETAMINOPHEN 500 MG PO TABS
ORAL_TABLET | ORAL | Status: AC
  Filled 2017-06-01: qty 2

## 2017-06-01 MED ORDER — CEFAZOLIN SODIUM-DEXTROSE 2-4 GM/100ML-% IV SOLN
INTRAVENOUS | Status: AC
  Filled 2017-06-01: qty 100

## 2017-06-01 MED ORDER — FENTANYL CITRATE (PF) 100 MCG/2ML IJ SOLN
INTRAMUSCULAR | Status: AC
  Filled 2017-06-01: qty 2

## 2017-06-01 MED ORDER — FENTANYL CITRATE (PF) 100 MCG/2ML IJ SOLN: 25.0000 ug | INTRAMUSCULAR | Status: DC | PRN

## 2017-06-01 MED ORDER — METHYLENE BLUE 0.5 % INJ SOLN
INTRAVENOUS | Status: AC
  Filled 2017-06-01: qty 10

## 2017-06-01 MED ORDER — EPHEDRINE SULFATE 50 MG/ML IJ SOLN
INTRAMUSCULAR | Status: DC | PRN
  Administered 2017-06-01 (×2): 10 mg via INTRAVENOUS

## 2017-06-01 MED ORDER — CEFAZOLIN SODIUM-DEXTROSE 2-4 GM/100ML-% IV SOLN
2.0000 g | INTRAVENOUS | Status: AC
  Administered 2017-06-01: 2 g via INTRAVENOUS

## 2017-06-01 MED ORDER — MIDAZOLAM HCL 2 MG/2ML IJ SOLN
INTRAMUSCULAR | Status: AC
  Filled 2017-06-01: qty 2

## 2017-06-01 MED ORDER — PROPOFOL 10 MG/ML IV BOLUS
INTRAVENOUS | Status: DC | PRN
  Administered 2017-06-01: 200 mg via INTRAVENOUS

## 2017-06-01 MED ORDER — ONDANSETRON HCL 4 MG/2ML IJ SOLN
INTRAMUSCULAR | Status: AC
  Filled 2017-06-01: qty 2

## 2017-06-01 MED ORDER — GABAPENTIN 300 MG PO CAPS
300.0000 mg | ORAL_CAPSULE | ORAL | Status: AC
  Administered 2017-06-01: 300 mg via ORAL

## 2017-06-01 MED ORDER — ONDANSETRON HCL 4 MG/2ML IJ SOLN: 4.0000 mg | Freq: Once | INTRAMUSCULAR | Status: DC | PRN

## 2017-06-01 SURGICAL SUPPLY — 54 items
ADH SKN CLS APL DERMABOND .7 (GAUZE/BANDAGES/DRESSINGS) ×1
BINDER BREAST LRG (GAUZE/BANDAGES/DRESSINGS) ×1 IMPLANT
BLADE SURG 10 STRL SS (BLADE) ×2 IMPLANT
BLADE SURG 15 STRL LF DISP TIS (BLADE) IMPLANT
BLADE SURG 15 STRL SS (BLADE)
CANISTER SUC SOCK COL 7IN (MISCELLANEOUS) IMPLANT
CANISTER SUCT 1200ML W/VALVE (MISCELLANEOUS) ×2 IMPLANT
CHLORAPREP W/TINT 26ML (MISCELLANEOUS) ×2 IMPLANT
CLIP VESOCCLUDE LG 6/CT (CLIP) ×2 IMPLANT
COVER BACK TABLE 60X90IN (DRAPES) ×2 IMPLANT
COVER MAYO STAND STRL (DRAPES) ×2 IMPLANT
COVER PROBE W GEL 5X96 (DRAPES) ×2 IMPLANT
DECANTER SPIKE VIAL GLASS SM (MISCELLANEOUS) IMPLANT
DERMABOND ADVANCED (GAUZE/BANDAGES/DRESSINGS) ×1
DERMABOND ADVANCED .7 DNX12 (GAUZE/BANDAGES/DRESSINGS) ×1 IMPLANT
DEVICE DUBIN W/COMP PLATE 8390 (MISCELLANEOUS) ×2 IMPLANT
DRAPE LAPAROSCOPIC ABDOMINAL (DRAPES) ×2 IMPLANT
DRAPE UTILITY XL STRL (DRAPES) ×2 IMPLANT
ELECT COATED BLADE 2.86 ST (ELECTRODE) ×2 IMPLANT
ELECT REM PT RETURN 9FT ADLT (ELECTROSURGICAL) ×2
ELECTRODE REM PT RTRN 9FT ADLT (ELECTROSURGICAL) ×1 IMPLANT
GAUZE SPONGE 4X4 12PLY STRL LF (GAUZE/BANDAGES/DRESSINGS) ×2 IMPLANT
GLOVE BIOGEL PI IND STRL 6 (GLOVE) IMPLANT
GLOVE BIOGEL PI IND STRL 6.5 (GLOVE) ×1 IMPLANT
GLOVE BIOGEL PI INDICATOR 6 (GLOVE) ×1
GLOVE BIOGEL PI INDICATOR 6.5 (GLOVE) ×1
GLOVE SURG SS PI 6.0 STRL IVOR (GLOVE) ×1 IMPLANT
GLOVE SURG SS PI 6.5 STRL IVOR (GLOVE) ×1 IMPLANT
GOWN STRL REUS W/ TWL LRG LVL3 (GOWN DISPOSABLE) ×1 IMPLANT
GOWN STRL REUS W/TWL 2XL LVL3 (GOWN DISPOSABLE) ×2 IMPLANT
GOWN STRL REUS W/TWL LRG LVL3 (GOWN DISPOSABLE) ×2
KIT MARKER MARGIN INK (KITS) ×2 IMPLANT
LIGHT WAVEGUIDE WIDE FLAT (MISCELLANEOUS) IMPLANT
NDL HYPO 25X1 1.5 SAFETY (NEEDLE) ×1 IMPLANT
NEEDLE HYPO 25X1 1.5 SAFETY (NEEDLE) ×2 IMPLANT
NS IRRIG 1000ML POUR BTL (IV SOLUTION) ×2 IMPLANT
PACK BASIN DAY SURGERY FS (CUSTOM PROCEDURE TRAY) ×2 IMPLANT
PENCIL BUTTON HOLSTER BLD 10FT (ELECTRODE) ×2 IMPLANT
SLEEVE SCD COMPRESS KNEE MED (MISCELLANEOUS) ×2 IMPLANT
SPONGE LAP 18X18 X RAY DECT (DISPOSABLE) ×2 IMPLANT
STAPLER VISISTAT 35W (STAPLE) IMPLANT
STRIP CLOSURE SKIN 1/2X4 (GAUZE/BANDAGES/DRESSINGS) ×2 IMPLANT
SUT MON AB 4-0 PC3 18 (SUTURE) ×2 IMPLANT
SUT SILK 2 0 SH (SUTURE) IMPLANT
SUT VIC AB 2-0 SH 18 (SUTURE) IMPLANT
SUT VIC AB 3-0 SH 27 (SUTURE) ×2
SUT VIC AB 3-0 SH 27X BRD (SUTURE) ×1 IMPLANT
SUT VICRYL 3-0 CR8 SH (SUTURE) ×1 IMPLANT
SYR BULB 3OZ (MISCELLANEOUS) ×2 IMPLANT
SYR CONTROL 10ML LL (SYRINGE) ×2 IMPLANT
TOWEL OR 17X24 6PK STRL BLUE (TOWEL DISPOSABLE) ×2 IMPLANT
TOWEL OR NON WOVEN STRL DISP B (DISPOSABLE) IMPLANT
TUBE CONNECTING 20X1/4 (TUBING) ×2 IMPLANT
YANKAUER SUCT BULB TIP NO VENT (SUCTIONS) ×2 IMPLANT

## 2017-06-01 NOTE — Anesthesia Postprocedure Evaluation (Signed)
Anesthesia Post Note  Patient: Sylvia Simpson  Procedure(s) Performed: LEFT RADIOACTIVE SEED GUIDED EXCISIONAL BREAST BIOPSY ERAS PATHWAY (Left Breast)     Patient location during evaluation: PACU Anesthesia Type: General Level of consciousness: awake and alert Pain management: pain level controlled Vital Signs Assessment: post-procedure vital signs reviewed and stable Respiratory status: spontaneous breathing, nonlabored ventilation, respiratory function stable and patient connected to nasal cannula oxygen Cardiovascular status: blood pressure returned to baseline and stable Postop Assessment: no apparent nausea or vomiting Anesthetic complications: no    Last Vitals:  Vitals:   06/01/17 0930 06/01/17 1000  BP: (!) 154/89 (!) 156/73  Pulse: 78 65  Resp: 13 16  Temp:  (!) 36.4 C  SpO2: 95% 94%    Last Pain:  Vitals:   06/01/17 1000  TempSrc: Oral  PainSc: 0-No pain                 Tiajuana Amass

## 2017-06-01 NOTE — Anesthesia Procedure Notes (Signed)
Procedure Name: LMA Insertion Performed by: Verita Lamb, CRNA Pre-anesthesia Checklist: Patient identified, Suction available, Emergency Drugs available, Patient being monitored and Timeout performed Induction Type: IV induction Ventilation: Mask ventilation without difficulty LMA: LMA inserted LMA Size: 4.0 Tube size: 4.0 (lma) mm Placement Confirmation: positive ETCO2,  CO2 detector and breath sounds checked- equal and bilateral Tube secured with: Tape Dental Injury: Teeth and Oropharynx as per pre-operative assessment

## 2017-06-01 NOTE — Interval H&P Note (Signed)
History and Physical Interval Note:  06/01/2017 7:51 AM  Sylvia Simpson  has presented today for surgery, with the diagnosis of left abnormal mammogram and left nipple discharge  The various methods of treatment have been discussed with the patient and family. After consideration of risks, benefits and other options for treatment, the patient has consented to  Procedure(s): LEFT RADIOACTIVE SEED GUIDED EXCISIONAL BREAST BIOPSY ERAS PATHWAY (Left) as a surgical intervention .  The patient's history has been reviewed, patient examined, no change in status, stable for surgery.  I have reviewed the patient's chart and labs.  Questions were answered to the patient's satisfaction.     [name]

## 2017-06-01 NOTE — Discharge Instructions (Addendum)
[facility] °Office Phone Number [phone] ° °BREAST BIOPSY/ PARTIAL MASTECTOMY: POST OP INSTRUCTIONS ° °Always review your discharge instruction sheet given to you by the facility where your surgery was performed. ° °IF YOU HAVE DISABILITY OR FAMILY LEAVE FORMS, YOU MUST BRING THEM TO THE OFFICE FOR PROCESSING.  DO NOT GIVE THEM TO YOUR DOCTOR. ° °1. A prescription for pain medication may be given to you upon discharge.  Take your pain medication as prescribed, if needed.  If narcotic pain medicine is not needed, then you may take acetaminophen (Tylenol) or ibuprofen (Advil) as needed. °2. Take your usually prescribed medications unless otherwise directed °3. If you need a refill on your pain medication, please contact your pharmacy.  They will contact our office to request authorization.  Prescriptions will not be filled after 5pm or on week-ends. °4. You should eat very light the first 24 hours after surgery, such as soup, crackers, pudding, etc.  Resume your normal diet the day after surgery. °5. Most patients will experience some swelling and bruising in the breast.  Ice packs and a good support bra will help.  Swelling and bruising can take several days to resolve.  °6. It is common to experience some constipation if taking pain medication after surgery.  Increasing fluid intake and taking a stool softener will usually help or prevent this problem from occurring.  A mild laxative (Milk of Magnesia or Miralax) should be taken according to package directions if there are no bowel movements after 48 hours. °7. Unless discharge instructions indicate otherwise, you may remove your bandages 48 hours after surgery, and you may shower at that time.  You may have steri-strips (small skin tapes) in place directly over the incision.  These strips should be left on the skin for 7-10 days.   Any sutures or staples will be removed at the office during your follow-up visit. °8. ACTIVITIES:  You may resume  regular daily activities (gradually increasing) beginning the next day.  Wearing a good support bra or sports bra (or the breast binder) minimizes pain and swelling.  You may have sexual intercourse when it is comfortable. °a. You may drive when you no longer are taking prescription pain medication, you can comfortably wear a seatbelt, and you can safely maneuver your car and apply brakes. °b. RETURN TO WORK:  __________1 week_______________ °9. You should see your doctor in the office for a follow-up appointment approximately two weeks after your surgery.  Your doctor’s nurse will typically make your follow-up appointment when she calls you with your pathology report.  Expect your pathology report 2-3 business days after your surgery.  You may call to check if you do not hear from us after three days. ° ° °WHEN TO CALL YOUR DOCTOR: °1. Fever over 101.0 °2. Nausea and/or vomiting. °3. Extreme swelling or bruising. °4. Continued bleeding from incision. °5. Increased pain, redness, or drainage from the incision. ° °The clinic staff is available to answer your questions during regular business hours.  Please don’t hesitate to call and ask to speak to one of the nurses for clinical concerns.  If you have a medical emergency, go to the nearest emergency room or call 911.  A surgeon from [facility] is always on call at the hospital. ° °For further questions, please visit [email or web address]  ° ° °Post Anesthesia Home Care Instructions ° °Activity: °Get plenty of rest for the remainder of the day. A responsible individual must stay with you for 24   hours following the procedure.  °For the next 24 hours, DO NOT: °-Drive a car °-Operate machinery °-Drink alcoholic beverages °-Take any medication unless instructed by your physician °-Make any legal decisions or sign important papers. ° °Meals: °Start with liquid foods such as gelatin or soup. Progress to regular foods as tolerated. Avoid greasy, spicy,  heavy foods. If nausea and/or vomiting occur, drink only clear liquids until the nausea and/or vomiting subsides. Call your physician if vomiting continues. ° °Special Instructions/Symptoms: °Your throat may feel dry or sore from the anesthesia or the breathing tube placed in your throat during surgery. If this causes discomfort, gargle with warm salt water. The discomfort should disappear within 24 hours. ° °If you had a scopolamine patch placed behind your ear for the management of post- operative nausea and/or vomiting: ° °1. The medication in the patch is effective for 72 hours, after which it should be removed.  Wrap patch in a tissue and discard in the trash. Wash hands thoroughly with soap and water. °2. You may remove the patch earlier than 72 hours if you experience unpleasant side effects which may include dry mouth, dizziness or visual disturbances. °3. Avoid touching the patch. Wash your hands with soap and water after contact with the patch. °  ° °

## 2017-06-01 NOTE — Transfer of Care (Signed)
Immediate Anesthesia Transfer of Care Note  Patient: Sylvia Simpson  Procedure(s) Performed: LEFT RADIOACTIVE SEED GUIDED EXCISIONAL BREAST BIOPSY ERAS PATHWAY (Left Breast)  Patient Location: PACU  Anesthesia Type:General  Level of Consciousness: awake, alert  and oriented  Airway & Oxygen Therapy: Patient Spontanous Breathing and Patient connected to face mask oxygen  Post-op Assessment: Report given to RN and Post -op Vital signs reviewed and stable  Post vital signs: Reviewed and stable  Last Vitals:  Vitals:   06/01/17 0636  BP: (!) 155/83  Pulse: 74  Resp: 18  Temp: 36.6 C  SpO2: 100%    Last Pain:  Vitals:   06/01/17 0636  TempSrc: Oral      Patients Stated Pain Goal: 0 (73/22/02 5427)  Complications: No apparent anesthesia complications

## 2017-06-01 NOTE — Op Note (Signed)
Left Breast Radioactive seed localized excisional biopsy  Indications: This patient presents with history of abnormal left mammogram, left nipple discharge, and discordant core needle biopsy  Pre-operative Diagnosis: abnormal left mammogram  Post-operative Diagnosis: Same  Surgeon: [name]   Anesthesia: General endotracheal anesthesia  ASA Class: 2  Procedure Details  The patient was seen in the Holding Room. The risks, benefits, complications, treatment options, and expected outcomes were discussed with the patient. The possibilities of bleeding, infection, the need for additional procedures, failure to diagnose a condition, and creating a complication requiring transfusion or operation were discussed with the patient. The patient concurred with the proposed plan, giving informed consent.  The site of surgery properly noted/marked. The patient was taken to Operating Room # 8, identified, and the procedure verified as Left Breast seed localized excisional biopsy. A Time Out was held and the above information confirmed.  The left breast and chest were prepped and draped in standard fashion. The lumpectomy was performed by creating a lateral circumareolar incision near the previously placed radioactive seed.  Dissection was carried down to around the point of maximum signal intensity. The cautery was used to perform the dissection.  Hemostasis was achieved with cautery.  The specimen was inked with the margin marker paint kit.    Specimen radiography confirmed inclusion of the mammographic lesion, the clip, and the seed.  The background signal in the breast was zero.  The wound was irrigated and closed with 3-0 vicryl in layers and 4-0 monocryl subcuticular suture.      Sterile dressings were applied. At the end of the operation, all sponge, instrument, and needle counts were correct.  Findings: grossly clear surgical margins and no adenopathy  Estimated Blood Loss:  min          Specimens: left breast tissue with seed        Complications:  None; patient tolerated the procedure well.         Disposition: PACU - hemodynamically stable.         Condition: stable

## 2017-06-02 ENCOUNTER — Encounter (HOSPITAL_BASED_OUTPATIENT_CLINIC_OR_DEPARTMENT_OTHER): Payer: Self-pay | Admitting: General Surgery

## 2017-06-02 NOTE — Progress Notes (Signed)
Please let patient know no cancer is seen.

## 2017-09-29 ENCOUNTER — Other Ambulatory Visit: Payer: Self-pay | Admitting: Nurse Practitioner

## 2017-09-29 DIAGNOSIS — Z1231 Encounter for screening mammogram for malignant neoplasm of breast: Secondary | ICD-10-CM

## 2017-10-05 DIAGNOSIS — R7309 Other abnormal glucose: Secondary | ICD-10-CM | POA: Diagnosis not present

## 2017-10-05 DIAGNOSIS — G59 Mononeuropathy in diseases classified elsewhere: Secondary | ICD-10-CM | POA: Diagnosis not present

## 2017-10-05 DIAGNOSIS — Z0000 Encounter for general adult medical examination without abnormal findings: Secondary | ICD-10-CM | POA: Diagnosis not present

## 2017-10-05 DIAGNOSIS — F419 Anxiety disorder, unspecified: Secondary | ICD-10-CM | POA: Diagnosis not present

## 2017-10-05 DIAGNOSIS — E785 Hyperlipidemia, unspecified: Secondary | ICD-10-CM | POA: Diagnosis not present

## 2017-10-05 DIAGNOSIS — N183 Chronic kidney disease, stage 3 (moderate): Secondary | ICD-10-CM | POA: Diagnosis not present

## 2017-10-05 DIAGNOSIS — Z853 Personal history of malignant neoplasm of breast: Secondary | ICD-10-CM | POA: Diagnosis not present

## 2017-10-05 DIAGNOSIS — Z1159 Encounter for screening for other viral diseases: Secondary | ICD-10-CM | POA: Diagnosis not present

## 2017-10-05 DIAGNOSIS — I10 Essential (primary) hypertension: Secondary | ICD-10-CM | POA: Diagnosis not present

## 2017-10-05 DIAGNOSIS — D649 Anemia, unspecified: Secondary | ICD-10-CM | POA: Diagnosis not present

## 2017-10-05 DIAGNOSIS — B354 Tinea corporis: Secondary | ICD-10-CM | POA: Diagnosis not present

## 2017-10-15 DIAGNOSIS — N183 Chronic kidney disease, stage 3 (moderate): Secondary | ICD-10-CM | POA: Diagnosis not present

## 2017-10-19 DIAGNOSIS — N183 Chronic kidney disease, stage 3 (moderate): Secondary | ICD-10-CM | POA: Diagnosis not present

## 2017-10-19 DIAGNOSIS — I129 Hypertensive chronic kidney disease with stage 1 through stage 4 chronic kidney disease, or unspecified chronic kidney disease: Secondary | ICD-10-CM | POA: Diagnosis not present

## 2017-11-02 DIAGNOSIS — I10 Essential (primary) hypertension: Secondary | ICD-10-CM | POA: Diagnosis not present

## 2017-11-02 DIAGNOSIS — Z853 Personal history of malignant neoplasm of breast: Secondary | ICD-10-CM | POA: Diagnosis not present

## 2017-11-02 DIAGNOSIS — D638 Anemia in other chronic diseases classified elsewhere: Secondary | ICD-10-CM | POA: Diagnosis not present

## 2017-11-02 DIAGNOSIS — N183 Chronic kidney disease, stage 3 (moderate): Secondary | ICD-10-CM | POA: Diagnosis not present

## 2017-11-02 DIAGNOSIS — E782 Mixed hyperlipidemia: Secondary | ICD-10-CM | POA: Diagnosis not present

## 2017-11-09 ENCOUNTER — Ambulatory Visit
Admission: RE | Admit: 2017-11-09 | Discharge: 2017-11-09 | Disposition: A | Discharge status: Home / Self Care | Payer: PPO | Source: Ambulatory Visit | Attending: Nurse Practitioner | Admitting: Nurse Practitioner

## 2017-11-09 DIAGNOSIS — Z1231 Encounter for screening mammogram for malignant neoplasm of breast: Secondary | ICD-10-CM | POA: Diagnosis not present

## 2017-12-14 ENCOUNTER — Ambulatory Visit (INDEPENDENT_AMBULATORY_CARE_PROVIDER_SITE_OTHER): Payer: PPO | Admitting: Neurology

## 2017-12-14 ENCOUNTER — Encounter: Payer: Self-pay | Admitting: Neurology

## 2017-12-14 DIAGNOSIS — G603 Idiopathic progressive neuropathy: Secondary | ICD-10-CM

## 2017-12-14 DIAGNOSIS — M5416 Radiculopathy, lumbar region: Secondary | ICD-10-CM | POA: Insufficient documentation

## 2017-12-14 DIAGNOSIS — G629 Polyneuropathy, unspecified: Secondary | ICD-10-CM

## 2017-12-14 HISTORY — DX: Polyneuropathy, unspecified: G62.9

## 2017-12-14 HISTORY — DX: Radiculopathy, lumbar region: M54.16

## 2017-12-14 NOTE — Progress Notes (Signed)
Please refer to EMG and nerve conduction study procedure note. 

## 2017-12-14 NOTE — Procedures (Signed)
     HISTORY:  Sylvia Simpson is a 72 year old patient with a history of bilateral foot numbness since 2012.  More recently, she has noted discomfort involving the right foot primarily, not on the left.  The patient has a history of prior lumbosacral spine surgery in 2012.  She is currently being evaluated for a possible neuropathy versus a lumbosacral radiculopathy.  NERVE CONDUCTION STUDIES:  Nerve conduction studies were performed on both lower extremities.  The distal motor latencies for the peroneal and posterior tibial nerves were within normal limits bilaterally with low motor amplitudes for the right peroneal nerve and for the posterior tibial nerves bilaterally.  The motor amplitude for the left peroneal nerve was normal.  Slowing was seen for the peroneal and posterior tibial nerves bilaterally.  Prolongation of the sural and peroneal sensory latencies were seen bilaterally.  The F-wave latencies for the posterior tibial nerves were prolonged bilaterally.  EMG STUDIES:  EMG study was performed on the right lower extremity:  The tibialis anterior muscle reveals 2 to 5K motor units with decreased recruitment. 1+ fibrillations and positive waves were seen. The peroneus tertius muscle reveals 2 to 5K motor units with decreased recruitment. 1+ fibrillations and positive waves were seen. The medial gastrocnemius muscle reveals 1 to 3K motor units with slightly reduced recruitment. No fibrillations or positive waves were seen. The vastus lateralis muscle reveals 2 to 4K motor units with full recruitment. No fibrillations or positive waves were seen. The iliopsoas muscle reveals 2 to 4K motor units with full recruitment. No fibrillations or positive waves were seen. The biceps femoris muscle (long head) reveals 2 to 5K motor units with decreased recruitment. 1+ fibrillations and positive waves were seen. The lumbosacral paraspinal muscles were tested at 3 levels, and revealed no  abnormalities of insertional activity at the upper and middle levels tested.  1+ positive waves were seen in the lower level.  There was good relaxation.   IMPRESSION:  Nerve conduction studies done on both lower extremities shows evidence of a primarily axonal peripheral neuropathy of moderate severity.  EMG evaluation of the right lower extremity shows findings consistent with an overlying L5 radiculopathy with acute and chronic features.  Jill Alexanders MD 12/14/2017 9:50 AM  [facility] [address] [place], [place] [address]  Phone [phone] Fax [phone]

## 2017-12-14 NOTE — Progress Notes (Signed)
[place]    Nerve / Sites Muscle Latency Ref. Amplitude Ref. Rel Amp Segments Distance Velocity Ref. Area    ms ms mV mV %  cm m/s m/s mVms  R Peroneal - EDB     Ankle EDB 5.7 ?6.5 1.8 ?2.0 100 Ankle - EDB 9   4.7     Fib head EDB 14.4  1.4  78.2 Fib head - Ankle 34 39 ?44 4.6     Pop fossa EDB 17.0  1.3  91.9 Pop fossa - Fib head 10 39 ?44 4.4         Pop fossa - Ankle      L Peroneal - EDB     Ankle EDB 5.4 ?6.5 6.2 ?2.0 100 Ankle - EDB 9   13.6     Fib head EDB 13.5  4.4  71.4 Fib head - Ankle 34 42 ?44 10.5     Pop fossa EDB 15.9  4.8  107 Pop fossa - Fib head 10 42 ?44 10.6         Pop fossa - Ankle      R Tibial - AH     Ankle AH 5.1 ?5.8 3.4 ?4.0 100 Ankle - AH 9   6.1     Pop fossa AH 15.5  1.2  36.1 Pop fossa - Ankle 39 37 ?41 3.2  L Tibial - AH     Ankle AH 5.4 ?5.8 1.5 ?4.0 100 Ankle - AH 9   3.0     Pop fossa AH 16.5  0.9  60.2 Pop fossa - Ankle 39 35 ?41 2.4             SNC    Nerve / Sites Rec. Site Peak Lat Ref.  Amp Ref. Segments Distance    ms ms V V  cm  R Sural - Ankle (Calf)     Calf Ankle 5.1 ?4.4 2 ?6 Calf - Ankle 14  L Sural - Ankle (Calf)     Calf Ankle 5.3 ?4.4 3 ?6 Calf - Ankle 14  R Superficial peroneal - Ankle     Lat leg Ankle 4.4 ?4.4 1 ?6 Lat leg - Ankle 14  L Superficial peroneal - Ankle     Lat leg Ankle 4.5 ?4.4 3 ?6 Lat leg - Ankle 14             F  Wave    Nerve F Lat Ref.   ms ms  R Tibial - AH 58.9 ?56.0  L Tibial - AH 61.0 ?56.0         EMG full

## 2018-01-10 DIAGNOSIS — Z853 Personal history of malignant neoplasm of breast: Secondary | ICD-10-CM | POA: Diagnosis not present

## 2018-01-10 DIAGNOSIS — I10 Essential (primary) hypertension: Secondary | ICD-10-CM | POA: Diagnosis not present

## 2018-01-10 DIAGNOSIS — E782 Mixed hyperlipidemia: Secondary | ICD-10-CM | POA: Diagnosis not present

## 2018-01-10 DIAGNOSIS — N183 Chronic kidney disease, stage 3 (moderate): Secondary | ICD-10-CM | POA: Diagnosis not present

## 2018-01-10 DIAGNOSIS — D638 Anemia in other chronic diseases classified elsewhere: Secondary | ICD-10-CM | POA: Diagnosis not present

## 2018-01-12 DIAGNOSIS — Z23 Encounter for immunization: Secondary | ICD-10-CM | POA: Diagnosis not present

## 2018-01-18 DIAGNOSIS — H35342 Macular cyst, hole, or pseudohole, left eye: Secondary | ICD-10-CM | POA: Diagnosis not present

## 2018-01-18 DIAGNOSIS — Z961 Presence of intraocular lens: Secondary | ICD-10-CM | POA: Diagnosis not present

## 2018-01-18 DIAGNOSIS — H31009 Unspecified chorioretinal scars, unspecified eye: Secondary | ICD-10-CM | POA: Diagnosis not present

## 2018-01-18 DIAGNOSIS — H35341 Macular cyst, hole, or pseudohole, right eye: Secondary | ICD-10-CM | POA: Diagnosis not present

## 2018-04-05 DIAGNOSIS — E782 Mixed hyperlipidemia: Secondary | ICD-10-CM | POA: Diagnosis not present

## 2018-04-05 DIAGNOSIS — E785 Hyperlipidemia, unspecified: Secondary | ICD-10-CM | POA: Diagnosis not present

## 2018-04-05 DIAGNOSIS — I10 Essential (primary) hypertension: Secondary | ICD-10-CM | POA: Diagnosis not present

## 2018-04-05 DIAGNOSIS — N183 Chronic kidney disease, stage 3 (moderate): Secondary | ICD-10-CM | POA: Diagnosis not present

## 2018-04-07 DIAGNOSIS — E782 Mixed hyperlipidemia: Secondary | ICD-10-CM | POA: Diagnosis not present

## 2018-04-07 DIAGNOSIS — E7841 Elevated Lipoprotein(a): Secondary | ICD-10-CM | POA: Diagnosis not present

## 2018-04-07 DIAGNOSIS — D630 Anemia in neoplastic disease: Secondary | ICD-10-CM | POA: Diagnosis not present

## 2018-04-07 DIAGNOSIS — I10 Essential (primary) hypertension: Secondary | ICD-10-CM | POA: Diagnosis not present

## 2018-04-07 DIAGNOSIS — Z853 Personal history of malignant neoplasm of breast: Secondary | ICD-10-CM | POA: Diagnosis not present

## 2018-04-07 DIAGNOSIS — E785 Hyperlipidemia, unspecified: Secondary | ICD-10-CM | POA: Diagnosis not present

## 2018-04-07 DIAGNOSIS — D6489 Other specified anemias: Secondary | ICD-10-CM | POA: Diagnosis not present

## 2018-04-07 DIAGNOSIS — N183 Chronic kidney disease, stage 3 (moderate): Secondary | ICD-10-CM | POA: Diagnosis not present

## 2018-04-11 DIAGNOSIS — H33051 Total retinal detachment, right eye: Secondary | ICD-10-CM | POA: Diagnosis not present

## 2018-04-11 DIAGNOSIS — H31093 Other chorioretinal scars, bilateral: Secondary | ICD-10-CM | POA: Diagnosis not present

## 2018-04-11 DIAGNOSIS — Z961 Presence of intraocular lens: Secondary | ICD-10-CM | POA: Diagnosis not present

## 2018-04-27 DIAGNOSIS — N183 Chronic kidney disease, stage 3 (moderate): Secondary | ICD-10-CM | POA: Diagnosis not present

## 2018-05-03 DIAGNOSIS — N183 Chronic kidney disease, stage 3 (moderate): Secondary | ICD-10-CM | POA: Diagnosis not present

## 2018-05-03 DIAGNOSIS — I129 Hypertensive chronic kidney disease with stage 1 through stage 4 chronic kidney disease, or unspecified chronic kidney disease: Secondary | ICD-10-CM | POA: Diagnosis not present

## 2018-05-20 DIAGNOSIS — N183 Chronic kidney disease, stage 3 (moderate): Secondary | ICD-10-CM | POA: Diagnosis not present

## 2018-05-20 DIAGNOSIS — I10 Essential (primary) hypertension: Secondary | ICD-10-CM | POA: Diagnosis not present

## 2018-05-20 DIAGNOSIS — D649 Anemia, unspecified: Secondary | ICD-10-CM | POA: Diagnosis not present

## 2018-05-20 DIAGNOSIS — E785 Hyperlipidemia, unspecified: Secondary | ICD-10-CM | POA: Diagnosis not present

## 2018-05-20 DIAGNOSIS — E782 Mixed hyperlipidemia: Secondary | ICD-10-CM | POA: Diagnosis not present

## 2018-05-20 DIAGNOSIS — D638 Anemia in other chronic diseases classified elsewhere: Secondary | ICD-10-CM | POA: Diagnosis not present

## 2018-05-20 DIAGNOSIS — Z853 Personal history of malignant neoplasm of breast: Secondary | ICD-10-CM | POA: Diagnosis not present

## 2018-05-30 DIAGNOSIS — D649 Anemia, unspecified: Secondary | ICD-10-CM | POA: Diagnosis not present

## 2018-05-30 DIAGNOSIS — N183 Chronic kidney disease, stage 3 (moderate): Secondary | ICD-10-CM | POA: Diagnosis not present

## 2018-05-30 DIAGNOSIS — E782 Mixed hyperlipidemia: Secondary | ICD-10-CM | POA: Diagnosis not present

## 2018-05-30 DIAGNOSIS — D638 Anemia in other chronic diseases classified elsewhere: Secondary | ICD-10-CM | POA: Diagnosis not present

## 2018-05-30 DIAGNOSIS — E785 Hyperlipidemia, unspecified: Secondary | ICD-10-CM | POA: Diagnosis not present

## 2018-05-30 DIAGNOSIS — E7841 Elevated Lipoprotein(a): Secondary | ICD-10-CM | POA: Diagnosis not present

## 2018-05-30 DIAGNOSIS — Z853 Personal history of malignant neoplasm of breast: Secondary | ICD-10-CM | POA: Diagnosis not present

## 2018-05-30 DIAGNOSIS — I10 Essential (primary) hypertension: Secondary | ICD-10-CM | POA: Diagnosis not present

## 2018-07-04 DIAGNOSIS — N183 Chronic kidney disease, stage 3 (moderate): Secondary | ICD-10-CM | POA: Diagnosis not present

## 2018-07-04 DIAGNOSIS — D638 Anemia in other chronic diseases classified elsewhere: Secondary | ICD-10-CM | POA: Diagnosis not present

## 2018-07-04 DIAGNOSIS — I10 Essential (primary) hypertension: Secondary | ICD-10-CM | POA: Diagnosis not present

## 2018-07-04 DIAGNOSIS — E785 Hyperlipidemia, unspecified: Secondary | ICD-10-CM | POA: Diagnosis not present

## 2018-07-04 DIAGNOSIS — Z853 Personal history of malignant neoplasm of breast: Secondary | ICD-10-CM | POA: Diagnosis not present

## 2018-07-04 DIAGNOSIS — E782 Mixed hyperlipidemia: Secondary | ICD-10-CM | POA: Diagnosis not present

## 2018-07-04 DIAGNOSIS — D649 Anemia, unspecified: Secondary | ICD-10-CM | POA: Diagnosis not present

## 2018-07-29 DIAGNOSIS — D638 Anemia in other chronic diseases classified elsewhere: Secondary | ICD-10-CM | POA: Diagnosis not present

## 2018-07-29 DIAGNOSIS — E785 Hyperlipidemia, unspecified: Secondary | ICD-10-CM | POA: Diagnosis not present

## 2018-07-29 DIAGNOSIS — E782 Mixed hyperlipidemia: Secondary | ICD-10-CM | POA: Diagnosis not present

## 2018-07-29 DIAGNOSIS — D649 Anemia, unspecified: Secondary | ICD-10-CM | POA: Diagnosis not present

## 2018-07-29 DIAGNOSIS — N183 Chronic kidney disease, stage 3 (moderate): Secondary | ICD-10-CM | POA: Diagnosis not present

## 2018-07-29 DIAGNOSIS — I10 Essential (primary) hypertension: Secondary | ICD-10-CM | POA: Diagnosis not present

## 2018-07-29 DIAGNOSIS — Z853 Personal history of malignant neoplasm of breast: Secondary | ICD-10-CM | POA: Diagnosis not present

## 2018-08-30 DIAGNOSIS — E785 Hyperlipidemia, unspecified: Secondary | ICD-10-CM | POA: Diagnosis not present

## 2018-08-30 DIAGNOSIS — I10 Essential (primary) hypertension: Secondary | ICD-10-CM | POA: Diagnosis not present

## 2018-08-30 DIAGNOSIS — D638 Anemia in other chronic diseases classified elsewhere: Secondary | ICD-10-CM | POA: Diagnosis not present

## 2018-08-30 DIAGNOSIS — E782 Mixed hyperlipidemia: Secondary | ICD-10-CM | POA: Diagnosis not present

## 2018-08-30 DIAGNOSIS — N183 Chronic kidney disease, stage 3 (moderate): Secondary | ICD-10-CM | POA: Diagnosis not present

## 2018-08-30 DIAGNOSIS — D649 Anemia, unspecified: Secondary | ICD-10-CM | POA: Diagnosis not present

## 2018-08-30 DIAGNOSIS — Z853 Personal history of malignant neoplasm of breast: Secondary | ICD-10-CM | POA: Diagnosis not present

## 2018-09-26 DIAGNOSIS — E782 Mixed hyperlipidemia: Secondary | ICD-10-CM | POA: Diagnosis not present

## 2018-09-26 DIAGNOSIS — N183 Chronic kidney disease, stage 3 (moderate): Secondary | ICD-10-CM | POA: Diagnosis not present

## 2018-09-26 DIAGNOSIS — I10 Essential (primary) hypertension: Secondary | ICD-10-CM | POA: Diagnosis not present

## 2018-09-26 DIAGNOSIS — R7303 Prediabetes: Secondary | ICD-10-CM | POA: Diagnosis not present

## 2018-09-26 DIAGNOSIS — Z853 Personal history of malignant neoplasm of breast: Secondary | ICD-10-CM | POA: Diagnosis not present

## 2018-09-26 DIAGNOSIS — D649 Anemia, unspecified: Secondary | ICD-10-CM | POA: Diagnosis not present

## 2018-09-26 DIAGNOSIS — D638 Anemia in other chronic diseases classified elsewhere: Secondary | ICD-10-CM | POA: Diagnosis not present

## 2018-10-10 ENCOUNTER — Other Ambulatory Visit: Payer: Self-pay | Admitting: Internal Medicine

## 2018-10-10 DIAGNOSIS — Z1231 Encounter for screening mammogram for malignant neoplasm of breast: Secondary | ICD-10-CM

## 2018-10-20 DIAGNOSIS — Z853 Personal history of malignant neoplasm of breast: Secondary | ICD-10-CM | POA: Diagnosis not present

## 2018-10-20 DIAGNOSIS — E785 Hyperlipidemia, unspecified: Secondary | ICD-10-CM | POA: Diagnosis not present

## 2018-10-20 DIAGNOSIS — I10 Essential (primary) hypertension: Secondary | ICD-10-CM | POA: Diagnosis not present

## 2018-10-20 DIAGNOSIS — Z1389 Encounter for screening for other disorder: Secondary | ICD-10-CM | POA: Diagnosis not present

## 2018-10-20 DIAGNOSIS — E782 Mixed hyperlipidemia: Secondary | ICD-10-CM | POA: Diagnosis not present

## 2018-10-20 DIAGNOSIS — N183 Chronic kidney disease, stage 3 (moderate): Secondary | ICD-10-CM | POA: Diagnosis not present

## 2018-10-20 DIAGNOSIS — D649 Anemia, unspecified: Secondary | ICD-10-CM | POA: Diagnosis not present

## 2018-10-20 DIAGNOSIS — Z13220 Encounter for screening for lipoid disorders: Secondary | ICD-10-CM | POA: Diagnosis not present

## 2018-10-20 DIAGNOSIS — Z0000 Encounter for general adult medical examination without abnormal findings: Secondary | ICD-10-CM | POA: Diagnosis not present

## 2018-10-20 DIAGNOSIS — E7849 Other hyperlipidemia: Secondary | ICD-10-CM | POA: Diagnosis not present

## 2018-11-08 DIAGNOSIS — N183 Chronic kidney disease, stage 3 (moderate): Secondary | ICD-10-CM | POA: Diagnosis not present

## 2018-11-15 DIAGNOSIS — I129 Hypertensive chronic kidney disease with stage 1 through stage 4 chronic kidney disease, or unspecified chronic kidney disease: Secondary | ICD-10-CM | POA: Diagnosis not present

## 2018-11-15 DIAGNOSIS — N183 Chronic kidney disease, stage 3 (moderate): Secondary | ICD-10-CM | POA: Diagnosis not present

## 2018-11-18 DIAGNOSIS — D638 Anemia in other chronic diseases classified elsewhere: Secondary | ICD-10-CM | POA: Diagnosis not present

## 2018-11-18 DIAGNOSIS — N183 Chronic kidney disease, stage 3 (moderate): Secondary | ICD-10-CM | POA: Diagnosis not present

## 2018-11-18 DIAGNOSIS — Z853 Personal history of malignant neoplasm of breast: Secondary | ICD-10-CM | POA: Diagnosis not present

## 2018-11-18 DIAGNOSIS — E782 Mixed hyperlipidemia: Secondary | ICD-10-CM | POA: Diagnosis not present

## 2018-11-18 DIAGNOSIS — I10 Essential (primary) hypertension: Secondary | ICD-10-CM | POA: Diagnosis not present

## 2018-11-21 DIAGNOSIS — I10 Essential (primary) hypertension: Secondary | ICD-10-CM | POA: Diagnosis not present

## 2018-11-21 DIAGNOSIS — D649 Anemia, unspecified: Secondary | ICD-10-CM | POA: Diagnosis not present

## 2018-11-21 DIAGNOSIS — D638 Anemia in other chronic diseases classified elsewhere: Secondary | ICD-10-CM | POA: Diagnosis not present

## 2018-11-21 DIAGNOSIS — N183 Chronic kidney disease, stage 3 (moderate): Secondary | ICD-10-CM | POA: Diagnosis not present

## 2018-11-21 DIAGNOSIS — Z853 Personal history of malignant neoplasm of breast: Secondary | ICD-10-CM | POA: Diagnosis not present

## 2018-11-21 DIAGNOSIS — E782 Mixed hyperlipidemia: Secondary | ICD-10-CM | POA: Diagnosis not present

## 2018-11-22 ENCOUNTER — Other Ambulatory Visit: Payer: Self-pay

## 2018-11-22 ENCOUNTER — Ambulatory Visit
Admission: RE | Admit: 2018-11-22 | Discharge: 2018-11-22 | Disposition: A | Discharge status: Home / Self Care | Payer: PPO | Source: Ambulatory Visit | Attending: Internal Medicine | Admitting: Internal Medicine

## 2018-11-22 DIAGNOSIS — Z1231 Encounter for screening mammogram for malignant neoplasm of breast: Secondary | ICD-10-CM | POA: Diagnosis not present

## 2018-12-29 DIAGNOSIS — Z23 Encounter for immunization: Secondary | ICD-10-CM | POA: Diagnosis not present

## 2019-01-12 DIAGNOSIS — Z853 Personal history of malignant neoplasm of breast: Secondary | ICD-10-CM | POA: Diagnosis not present

## 2019-01-12 DIAGNOSIS — D638 Anemia in other chronic diseases classified elsewhere: Secondary | ICD-10-CM | POA: Diagnosis not present

## 2019-01-12 DIAGNOSIS — E782 Mixed hyperlipidemia: Secondary | ICD-10-CM | POA: Diagnosis not present

## 2019-01-12 DIAGNOSIS — N183 Chronic kidney disease, stage 3 (moderate): Secondary | ICD-10-CM | POA: Diagnosis not present

## 2019-01-12 DIAGNOSIS — D649 Anemia, unspecified: Secondary | ICD-10-CM | POA: Diagnosis not present

## 2019-01-12 DIAGNOSIS — I10 Essential (primary) hypertension: Secondary | ICD-10-CM | POA: Diagnosis not present

## 2019-02-17 DIAGNOSIS — E785 Hyperlipidemia, unspecified: Secondary | ICD-10-CM | POA: Diagnosis not present

## 2019-02-17 DIAGNOSIS — I10 Essential (primary) hypertension: Secondary | ICD-10-CM | POA: Diagnosis not present

## 2019-02-17 DIAGNOSIS — N183 Chronic kidney disease, stage 3 unspecified: Secondary | ICD-10-CM | POA: Diagnosis not present

## 2019-02-17 DIAGNOSIS — D638 Anemia in other chronic diseases classified elsewhere: Secondary | ICD-10-CM | POA: Diagnosis not present

## 2019-02-17 DIAGNOSIS — Z853 Personal history of malignant neoplasm of breast: Secondary | ICD-10-CM | POA: Diagnosis not present

## 2019-02-17 DIAGNOSIS — E782 Mixed hyperlipidemia: Secondary | ICD-10-CM | POA: Diagnosis not present

## 2019-02-17 DIAGNOSIS — D649 Anemia, unspecified: Secondary | ICD-10-CM | POA: Diagnosis not present

## 2019-03-03 DIAGNOSIS — E782 Mixed hyperlipidemia: Secondary | ICD-10-CM | POA: Diagnosis not present

## 2019-03-03 DIAGNOSIS — D638 Anemia in other chronic diseases classified elsewhere: Secondary | ICD-10-CM | POA: Diagnosis not present

## 2019-03-03 DIAGNOSIS — I10 Essential (primary) hypertension: Secondary | ICD-10-CM | POA: Diagnosis not present

## 2019-03-03 DIAGNOSIS — Z853 Personal history of malignant neoplasm of breast: Secondary | ICD-10-CM | POA: Diagnosis not present

## 2019-03-03 DIAGNOSIS — D649 Anemia, unspecified: Secondary | ICD-10-CM | POA: Diagnosis not present

## 2019-03-03 DIAGNOSIS — E785 Hyperlipidemia, unspecified: Secondary | ICD-10-CM | POA: Diagnosis not present

## 2019-04-03 DIAGNOSIS — D649 Anemia, unspecified: Secondary | ICD-10-CM | POA: Diagnosis not present

## 2019-04-03 DIAGNOSIS — E782 Mixed hyperlipidemia: Secondary | ICD-10-CM | POA: Diagnosis not present

## 2019-04-03 DIAGNOSIS — Z853 Personal history of malignant neoplasm of breast: Secondary | ICD-10-CM | POA: Diagnosis not present

## 2019-04-03 DIAGNOSIS — I10 Essential (primary) hypertension: Secondary | ICD-10-CM | POA: Diagnosis not present

## 2019-04-11 DIAGNOSIS — Z961 Presence of intraocular lens: Secondary | ICD-10-CM | POA: Diagnosis not present

## 2019-04-11 DIAGNOSIS — H31093 Other chorioretinal scars, bilateral: Secondary | ICD-10-CM | POA: Diagnosis not present

## 2019-04-11 DIAGNOSIS — H33051 Total retinal detachment, right eye: Secondary | ICD-10-CM | POA: Diagnosis not present

## 2019-05-02 DIAGNOSIS — E782 Mixed hyperlipidemia: Secondary | ICD-10-CM | POA: Diagnosis not present

## 2019-05-02 DIAGNOSIS — R7303 Prediabetes: Secondary | ICD-10-CM | POA: Diagnosis not present

## 2019-05-02 DIAGNOSIS — I10 Essential (primary) hypertension: Secondary | ICD-10-CM | POA: Diagnosis not present

## 2019-05-02 DIAGNOSIS — D638 Anemia in other chronic diseases classified elsewhere: Secondary | ICD-10-CM | POA: Diagnosis not present

## 2019-05-15 DIAGNOSIS — D649 Anemia, unspecified: Secondary | ICD-10-CM | POA: Diagnosis not present

## 2019-05-15 DIAGNOSIS — E785 Hyperlipidemia, unspecified: Secondary | ICD-10-CM | POA: Diagnosis not present

## 2019-05-15 DIAGNOSIS — Z853 Personal history of malignant neoplasm of breast: Secondary | ICD-10-CM | POA: Diagnosis not present

## 2019-05-15 DIAGNOSIS — E782 Mixed hyperlipidemia: Secondary | ICD-10-CM | POA: Diagnosis not present

## 2019-05-15 DIAGNOSIS — I10 Essential (primary) hypertension: Secondary | ICD-10-CM | POA: Diagnosis not present

## 2019-05-25 DIAGNOSIS — N183 Chronic kidney disease, stage 3 unspecified: Secondary | ICD-10-CM | POA: Diagnosis not present

## 2019-05-30 DIAGNOSIS — I129 Hypertensive chronic kidney disease with stage 1 through stage 4 chronic kidney disease, or unspecified chronic kidney disease: Secondary | ICD-10-CM | POA: Diagnosis not present

## 2019-05-30 DIAGNOSIS — N1831 Chronic kidney disease, stage 3a: Secondary | ICD-10-CM | POA: Diagnosis not present

## 2019-05-30 DIAGNOSIS — N183 Chronic kidney disease, stage 3 unspecified: Secondary | ICD-10-CM | POA: Diagnosis not present

## 2019-06-02 DIAGNOSIS — E782 Mixed hyperlipidemia: Secondary | ICD-10-CM | POA: Diagnosis not present

## 2019-06-02 DIAGNOSIS — Z853 Personal history of malignant neoplasm of breast: Secondary | ICD-10-CM | POA: Diagnosis not present

## 2019-06-02 DIAGNOSIS — I10 Essential (primary) hypertension: Secondary | ICD-10-CM | POA: Diagnosis not present

## 2019-06-02 DIAGNOSIS — D638 Anemia in other chronic diseases classified elsewhere: Secondary | ICD-10-CM | POA: Diagnosis not present

## 2019-06-02 DIAGNOSIS — D649 Anemia, unspecified: Secondary | ICD-10-CM | POA: Diagnosis not present

## 2019-06-02 DIAGNOSIS — E785 Hyperlipidemia, unspecified: Secondary | ICD-10-CM | POA: Diagnosis not present

## 2019-06-20 ENCOUNTER — Other Ambulatory Visit: Payer: Self-pay | Admitting: Geriatric Medicine

## 2019-06-20 ENCOUNTER — Other Ambulatory Visit: Payer: Self-pay | Admitting: Internal Medicine

## 2019-06-20 DIAGNOSIS — Z1231 Encounter for screening mammogram for malignant neoplasm of breast: Secondary | ICD-10-CM

## 2019-06-26 IMAGING — MG MM BREAST SURGICAL SPECIMEN
1 series · 1 of 1 positions shown · non-contrast
Comparison: 05/31/2017 and earlier

CLINICAL DATA: Status post seed localized excisional biopsy for
probable atypical papilloma in the left breast.

EXAM:
SPECIMEN RADIOGRAPH OF THE LEFT BREAST

[L]
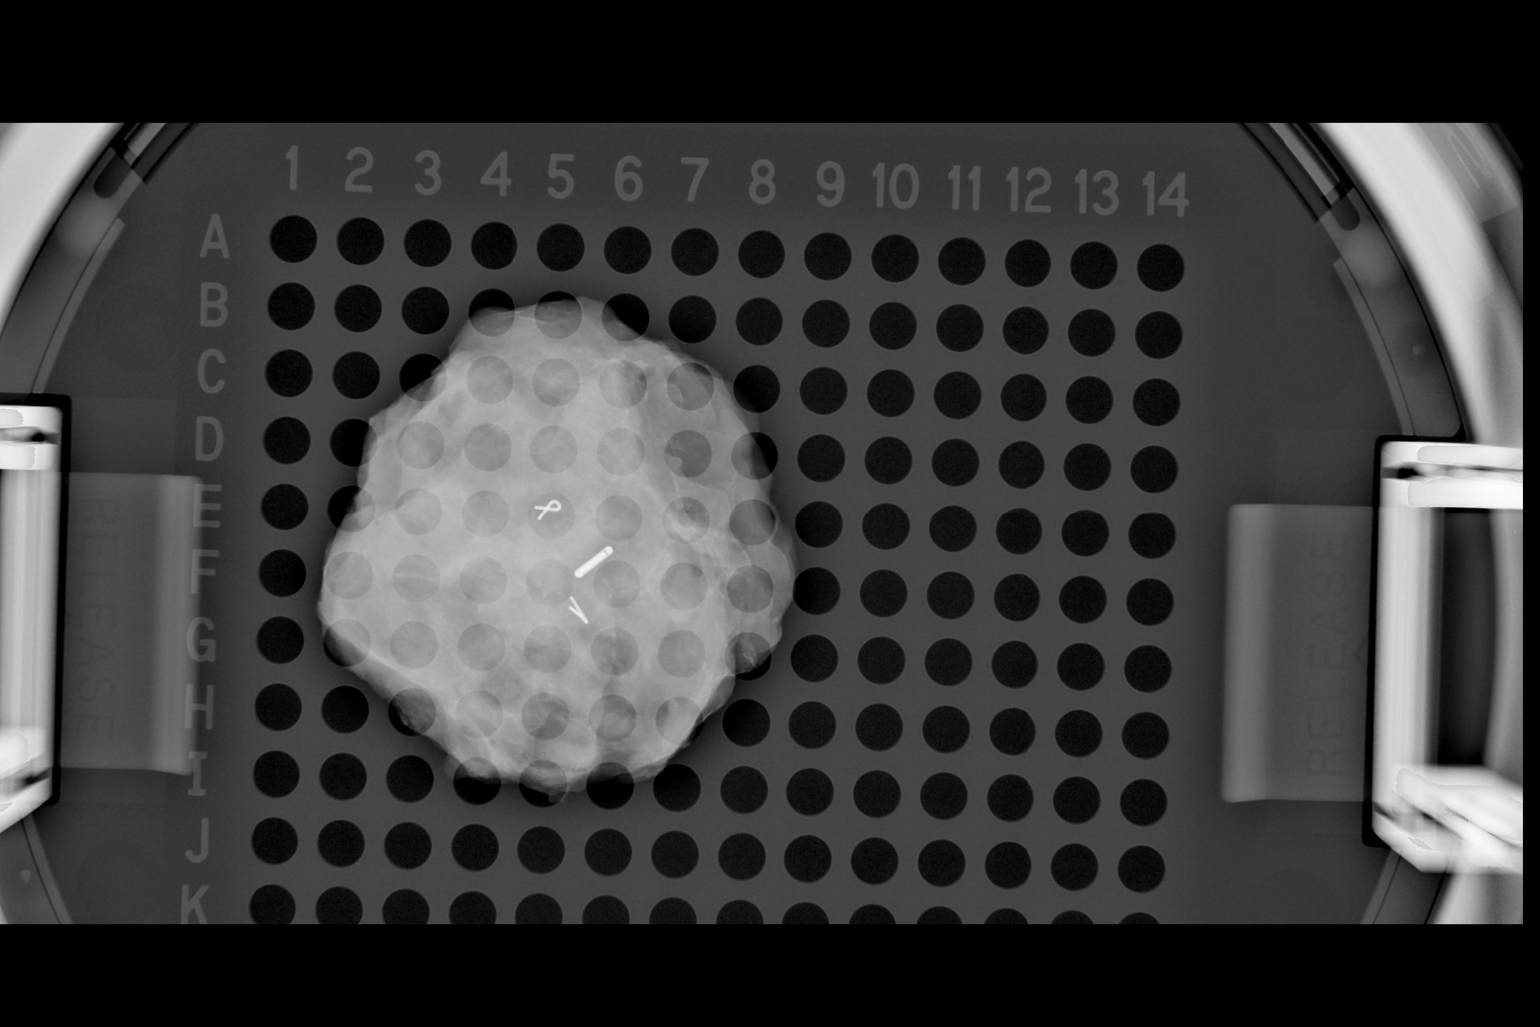

[1 of 1 positions shown; findings below may reference images not displayed]

FINDINGS: Status post excision of the left breast. The radioactive seed and
two ribbon shaped biopsy marker clips are present, completely
intact, and were marked for pathology.
IMPRESSION: Specimen radiograph of the left breast.

## 2019-07-12 DIAGNOSIS — E785 Hyperlipidemia, unspecified: Secondary | ICD-10-CM | POA: Diagnosis not present

## 2019-07-12 DIAGNOSIS — I10 Essential (primary) hypertension: Secondary | ICD-10-CM | POA: Diagnosis not present

## 2019-07-12 DIAGNOSIS — Z853 Personal history of malignant neoplasm of breast: Secondary | ICD-10-CM | POA: Diagnosis not present

## 2019-07-12 DIAGNOSIS — D638 Anemia in other chronic diseases classified elsewhere: Secondary | ICD-10-CM | POA: Diagnosis not present

## 2019-07-12 DIAGNOSIS — E7849 Other hyperlipidemia: Secondary | ICD-10-CM | POA: Diagnosis not present

## 2019-07-12 DIAGNOSIS — N183 Chronic kidney disease, stage 3 unspecified: Secondary | ICD-10-CM | POA: Diagnosis not present

## 2019-07-12 DIAGNOSIS — E782 Mixed hyperlipidemia: Secondary | ICD-10-CM | POA: Diagnosis not present

## 2019-07-12 DIAGNOSIS — D649 Anemia, unspecified: Secondary | ICD-10-CM | POA: Diagnosis not present

## 2019-08-16 DIAGNOSIS — D638 Anemia in other chronic diseases classified elsewhere: Secondary | ICD-10-CM | POA: Diagnosis not present

## 2019-08-16 DIAGNOSIS — E782 Mixed hyperlipidemia: Secondary | ICD-10-CM | POA: Diagnosis not present

## 2019-08-16 DIAGNOSIS — N183 Chronic kidney disease, stage 3 unspecified: Secondary | ICD-10-CM | POA: Diagnosis not present

## 2019-08-16 DIAGNOSIS — E785 Hyperlipidemia, unspecified: Secondary | ICD-10-CM | POA: Diagnosis not present

## 2019-08-16 DIAGNOSIS — E7849 Other hyperlipidemia: Secondary | ICD-10-CM | POA: Diagnosis not present

## 2019-08-16 DIAGNOSIS — Z853 Personal history of malignant neoplasm of breast: Secondary | ICD-10-CM | POA: Diagnosis not present

## 2019-08-16 DIAGNOSIS — I10 Essential (primary) hypertension: Secondary | ICD-10-CM | POA: Diagnosis not present

## 2019-08-16 DIAGNOSIS — D649 Anemia, unspecified: Secondary | ICD-10-CM | POA: Diagnosis not present

## 2019-09-04 DIAGNOSIS — E785 Hyperlipidemia, unspecified: Secondary | ICD-10-CM | POA: Diagnosis not present

## 2019-09-04 DIAGNOSIS — N183 Chronic kidney disease, stage 3 unspecified: Secondary | ICD-10-CM | POA: Diagnosis not present

## 2019-09-04 DIAGNOSIS — D649 Anemia, unspecified: Secondary | ICD-10-CM | POA: Diagnosis not present

## 2019-09-04 DIAGNOSIS — E7849 Other hyperlipidemia: Secondary | ICD-10-CM | POA: Diagnosis not present

## 2019-09-04 DIAGNOSIS — E782 Mixed hyperlipidemia: Secondary | ICD-10-CM | POA: Diagnosis not present

## 2019-09-04 DIAGNOSIS — Z853 Personal history of malignant neoplasm of breast: Secondary | ICD-10-CM | POA: Diagnosis not present

## 2019-09-04 DIAGNOSIS — I10 Essential (primary) hypertension: Secondary | ICD-10-CM | POA: Diagnosis not present

## 2019-10-02 DIAGNOSIS — Z853 Personal history of malignant neoplasm of breast: Secondary | ICD-10-CM | POA: Diagnosis not present

## 2019-10-02 DIAGNOSIS — E785 Hyperlipidemia, unspecified: Secondary | ICD-10-CM | POA: Diagnosis not present

## 2019-10-02 DIAGNOSIS — E782 Mixed hyperlipidemia: Secondary | ICD-10-CM | POA: Diagnosis not present

## 2019-10-02 DIAGNOSIS — I10 Essential (primary) hypertension: Secondary | ICD-10-CM | POA: Diagnosis not present

## 2019-10-02 DIAGNOSIS — D649 Anemia, unspecified: Secondary | ICD-10-CM | POA: Diagnosis not present

## 2019-10-02 DIAGNOSIS — N183 Chronic kidney disease, stage 3 unspecified: Secondary | ICD-10-CM | POA: Diagnosis not present

## 2019-10-31 DIAGNOSIS — D638 Anemia in other chronic diseases classified elsewhere: Secondary | ICD-10-CM | POA: Diagnosis not present

## 2019-10-31 DIAGNOSIS — R7309 Other abnormal glucose: Secondary | ICD-10-CM | POA: Diagnosis not present

## 2019-10-31 DIAGNOSIS — I10 Essential (primary) hypertension: Secondary | ICD-10-CM | POA: Diagnosis not present

## 2019-10-31 DIAGNOSIS — Z853 Personal history of malignant neoplasm of breast: Secondary | ICD-10-CM | POA: Diagnosis not present

## 2019-10-31 DIAGNOSIS — Z0000 Encounter for general adult medical examination without abnormal findings: Secondary | ICD-10-CM | POA: Diagnosis not present

## 2019-10-31 DIAGNOSIS — N183 Chronic kidney disease, stage 3 unspecified: Secondary | ICD-10-CM | POA: Diagnosis not present

## 2019-10-31 DIAGNOSIS — M5441 Lumbago with sciatica, right side: Secondary | ICD-10-CM | POA: Diagnosis not present

## 2019-10-31 DIAGNOSIS — Z1389 Encounter for screening for other disorder: Secondary | ICD-10-CM | POA: Diagnosis not present

## 2019-10-31 DIAGNOSIS — E785 Hyperlipidemia, unspecified: Secondary | ICD-10-CM | POA: Diagnosis not present

## 2019-11-03 DIAGNOSIS — I10 Essential (primary) hypertension: Secondary | ICD-10-CM | POA: Diagnosis not present

## 2019-11-03 DIAGNOSIS — E785 Hyperlipidemia, unspecified: Secondary | ICD-10-CM | POA: Diagnosis not present

## 2019-11-03 DIAGNOSIS — Z853 Personal history of malignant neoplasm of breast: Secondary | ICD-10-CM | POA: Diagnosis not present

## 2019-11-03 DIAGNOSIS — N183 Chronic kidney disease, stage 3 unspecified: Secondary | ICD-10-CM | POA: Diagnosis not present

## 2019-11-22 DIAGNOSIS — N1831 Chronic kidney disease, stage 3a: Secondary | ICD-10-CM | POA: Diagnosis not present

## 2019-11-24 ENCOUNTER — Ambulatory Visit
Admission: RE | Admit: 2019-11-24 | Discharge: 2019-11-24 | Disposition: A | Discharge status: Home / Self Care | Payer: PPO | Source: Ambulatory Visit | Attending: Internal Medicine | Admitting: Internal Medicine

## 2019-11-24 ENCOUNTER — Other Ambulatory Visit: Payer: Self-pay

## 2019-11-24 DIAGNOSIS — Z1231 Encounter for screening mammogram for malignant neoplasm of breast: Secondary | ICD-10-CM

## 2019-11-28 DIAGNOSIS — N1831 Chronic kidney disease, stage 3a: Secondary | ICD-10-CM | POA: Diagnosis not present

## 2019-11-28 DIAGNOSIS — I129 Hypertensive chronic kidney disease with stage 1 through stage 4 chronic kidney disease, or unspecified chronic kidney disease: Secondary | ICD-10-CM | POA: Diagnosis not present

## 2019-11-28 DIAGNOSIS — N183 Chronic kidney disease, stage 3 unspecified: Secondary | ICD-10-CM | POA: Diagnosis not present

## 2019-11-29 DIAGNOSIS — I10 Essential (primary) hypertension: Secondary | ICD-10-CM | POA: Diagnosis not present

## 2019-11-29 DIAGNOSIS — Z853 Personal history of malignant neoplasm of breast: Secondary | ICD-10-CM | POA: Diagnosis not present

## 2019-11-29 DIAGNOSIS — E785 Hyperlipidemia, unspecified: Secondary | ICD-10-CM | POA: Diagnosis not present

## 2019-11-29 DIAGNOSIS — N183 Chronic kidney disease, stage 3 unspecified: Secondary | ICD-10-CM | POA: Diagnosis not present

## 2020-01-11 DIAGNOSIS — Z23 Encounter for immunization: Secondary | ICD-10-CM | POA: Diagnosis not present

## 2020-01-16 DIAGNOSIS — D638 Anemia in other chronic diseases classified elsewhere: Secondary | ICD-10-CM | POA: Diagnosis not present

## 2020-01-16 DIAGNOSIS — N183 Chronic kidney disease, stage 3 unspecified: Secondary | ICD-10-CM | POA: Diagnosis not present

## 2020-01-16 DIAGNOSIS — I10 Essential (primary) hypertension: Secondary | ICD-10-CM | POA: Diagnosis not present

## 2020-01-16 DIAGNOSIS — Z853 Personal history of malignant neoplasm of breast: Secondary | ICD-10-CM | POA: Diagnosis not present

## 2020-01-16 DIAGNOSIS — E785 Hyperlipidemia, unspecified: Secondary | ICD-10-CM | POA: Diagnosis not present

## 2020-01-23 ENCOUNTER — Other Ambulatory Visit: Payer: Self-pay

## 2020-01-23 ENCOUNTER — Encounter (INDEPENDENT_AMBULATORY_CARE_PROVIDER_SITE_OTHER): Payer: Self-pay | Admitting: Ophthalmology

## 2020-01-23 ENCOUNTER — Ambulatory Visit (INDEPENDENT_AMBULATORY_CARE_PROVIDER_SITE_OTHER): Payer: PPO | Admitting: Ophthalmology

## 2020-01-23 DIAGNOSIS — H35341 Macular cyst, hole, or pseudohole, right eye: Secondary | ICD-10-CM

## 2020-01-23 DIAGNOSIS — H31009 Unspecified chorioretinal scars, unspecified eye: Secondary | ICD-10-CM | POA: Diagnosis not present

## 2020-01-23 DIAGNOSIS — Z8669 Personal history of other diseases of the nervous system and sense organs: Secondary | ICD-10-CM | POA: Diagnosis not present

## 2020-01-23 DIAGNOSIS — H35342 Macular cyst, hole, or pseudohole, left eye: Secondary | ICD-10-CM | POA: Diagnosis not present

## 2020-01-23 NOTE — Patient Instructions (Signed)
Patient asked to report promptly if new visual acuity difficulties or decline in vision develops

## 2020-01-23 NOTE — Progress Notes (Signed)
01/23/2020     CHIEF COMPLAINT Patient presents for Retina Follow Up   HISTORY OF PRESENT ILLNESS: Sylvia Simpson is a 74 y.o. female who presents to the clinic today for:   HPI    Retina Follow Up    Diagnosis: Mac Hole and Mac cyst.  In both eyes.  Severity is moderate.  Duration of 2 years.  Since onset it is stable.  I, the attending physician,  performed the HPI with the patient and updated documentation appropriately.          Comments    2 Year f\u OU. FP  Pt states vision has been stable. Pt denies FOL and floaters.       Last edited by Tilda Franco on 01/23/2020  8:11 AM. (History)      Referring physician: Lajean Manes, MD [address],  [place] [address]  HISTORICAL INFORMATION:   Selected notes from the [facility]: No current outpatient medications on file. (Ophthalmic Drugs)   No current facility-administered medications for this visit. (Ophthalmic Drugs)   Current Outpatient Medications (Other)  Medication Sig  . aspirin 325 MG EC tablet Take 325 mg by mouth daily.  Wallace Cullens POWD by Does not apply route.  Marland Kitchen co-enzyme Q-10 30 MG capsule Take 30 mg by mouth 3 (three) times daily.  . enalapril (VASOTEC) 20 MG tablet Take 20 mg by mouth daily.  . Ferrous Sulfate (FERATAB PO) Take 325 mg by mouth.  . gabapentin (NEURONTIN) 300 MG capsule Take 300 mg by mouth at bedtime.  . Garlic 6295 MG CAPS Take by mouth.  . hydrochlorothiazide (HYDRODIURIL) 25 MG tablet Take 25 mg by mouth daily.  . Lecithin 400 MG CAPS Take by mouth.  Marland Kitchen LORazepam (ATIVAN) 1 MG tablet Take 1 mg by mouth 2 (two) times daily.  . metoprolol tartrate (LOPRESSOR) 25 MG tablet Take 25 mg by mouth 2 (two) times daily.  . Multiple Vitamin (MULTIVITAMIN WITH MINERALS) TABS tablet Take 1 tablet by mouth daily.  . Omega 3 1000 MG CAPS Take by mouth 3 (three) times daily.  Marland Kitchen oxyCODONE (OXY IR/ROXICODONE) 5 MG  immediate release tablet Take 1-2 tablets (5-10 mg total) by mouth every 6 (six) hours as needed for moderate pain, severe pain or breakthrough pain.  . polycarbophil (FIBERCON) 625 MG tablet Take 625 mg by mouth daily.  . polyethylene glycol (MIRALAX / GLYCOLAX) packet Take 17 g by mouth daily.  . pravastatin (PRAVACHOL) 20 MG tablet Take 20 mg by mouth daily.  . Probiotic Product (CULTURELLE PRO-WELL PO) Take by mouth.  . traMADol (ULTRAM) 50 MG tablet Take by mouth every 6 (six) hours as needed.  . vitamin A 8000 UNIT capsule Take 8,000 Units by mouth daily.  . vitamin C (ASCORBIC ACID) 500 MG tablet Take 500 mg by mouth daily.   No current facility-administered medications for this visit. (Other)      REVIEW OF SYSTEMS:    ALLERGIES Allergies  Allergen Reactions  . Latex Rash    PAST MEDICAL HISTORY Past Medical History:  Diagnosis Date  . Anxiety   . Asthma    as child, no issues now  . Cancer [facility]) 1997   right breast cancer-lumpectomy,chemo and radiation  . Hypertension   . Lumbar radiculopathy, right 12/14/2017   L5  . Peripheral neuropathy 12/14/2017   Past Surgical History:  Procedure Laterality Date  .  ABDOMINAL HYSTERECTOMY    . BACK SURGERY    . BREAST EXCISIONAL BIOPSY Left 2019   benign  . BREAST LUMPECTOMY Right 1997  . CHOLECYSTECTOMY    . EYE SURGERY     retinal detachment  . RADIOACTIVE SEED GUIDED EXCISIONAL BREAST BIOPSY Left 06/01/2017   Procedure: LEFT RADIOACTIVE SEED GUIDED EXCISIONAL BREAST BIOPSY ERAS PATHWAY;  Surgeon: [name], MD;  Location: [facility];  Service: General;  Laterality: Left;  . TUBAL LIGATION      FAMILY HISTORY Family History  Problem Relation Age of Onset  . Breast cancer Paternal Aunt 69    SOCIAL HISTORY Social History   Tobacco Use  . Smoking status: Former Research scientist (life sciences)  . Smokeless tobacco: Never Used  Substance Use Topics  . Alcohol use: Yes    Comment: social  . Drug use: No           OPHTHALMIC EXAM:  Base Eye Exam    Visual Acuity (Snellen - Linear)      Right Left   Dist cc 20/40 20/30 +   Dist ph cc 20/30 -1        Tonometry (Tonopen, 8:18 AM)      Right Left   Pressure 14 10       Pupils      Pupils Dark Light Shape React APD   Right PERRL 4 3 Round Brisk None   Left PERRL 4 3 Round Brisk None       Visual Fields (Counting fingers)      Left Right    Full Full       Neuro/Psych    Oriented x3: Yes   Mood/Affect: Normal       Dilation    Both eyes: 1.0% Mydriacyl, 2.5% Phenylephrine @ 8:18 AM        Slit Lamp and Fundus Exam    External Exam      Right Left   External Normal Normal       Slit Lamp Exam      Right Left   Lids/Lashes Normal Normal   Conjunctiva/Sclera White and quiet White and quiet   Cornea Clear Clear   Anterior Chamber Deep and quiet Deep and quiet   Iris Round and reactive Round and reactive   Lens Centered posterior chamber intraocular lens    Anterior Vitreous Normal Normal       Fundus Exam      Right Left   Posterior Vitreous Clear vitrectomized Clear vitrectomized   Disc Normal Normal   C/D Ratio 0.45 0.45   Macula Normal Normal   Vessels Normal Normal   Periphery Normal no holes or tears Pigmented chorioretinal scarring inferotemporal and temporal in region of prior retinal detachment, no new retinal holes          IMAGING AND PROCEDURES  Imaging and Procedures for 01/23/20  Color Fundus Photography Optos - OU - Both Eyes       Right Eye Progression has been stable. Disc findings include normal observations. Macula : normal observations. Vessels : normal observations.   Left Eye Progression has been stable. Disc findings include normal observations. Macula : normal observations. Vessels : normal observations.   Notes OS, chorioretinal scarring inferotemporal and temporal in the region of prior retinal detachment.  Retina is attached.  A prior macular hole remains closed.  OD, history  of macular hole, stable with excellent visual acuity as well  ASSESSMENT/PLAN:  No problem-specific Assessment & Plan notes found for this encounter.      ICD-10-CM   1. Macular hole of left eye  H35.342 Color Fundus Photography Optos - OU - Both Eyes  2. Chorioretinal scar, unspecified laterality  H31.009 Color Fundus Photography Optos - OU - Both Eyes  3. Macular hole of right eye  H35.341 Color Fundus Photography Optos - OU - Both Eyes  4. History of retinal detachment  Z86.69     1.  2.  3.  Ophthalmic Meds Ordered this visit:  No orders of the defined types were placed in this encounter.      Return in about 2 years (around 01/22/2022) for DILATE OU, OCT, COLOR FP.  Patient Instructions  Patient asked to report promptly if new visual acuity difficulties or decline in vision develops    Explained the diagnoses, plan, and follow up with the patient and they expressed understanding.  Patient expressed understanding of the importance of proper follow up care.   Clent Demark Chela Sutphen M.D. Diseases & Surgery of the Retina and Vitreous Retina & Diabetic [place] 01/23/20     Abbreviations: M myopia (nearsighted); A astigmatism; H hyperopia (farsighted); P presbyopia; Mrx spectacle prescription;  CTL contact lenses; OD right eye; OS left eye; OU both eyes  XT exotropia; ET esotropia; PEK punctate epithelial keratitis; PEE punctate epithelial erosions; DES dry eye syndrome; MGD meibomian gland dysfunction; ATs artificial tears; PFAT's preservative free artificial tears; [place] nuclear sclerotic cataract; PSC posterior subcapsular cataract; ERM epi-retinal membrane; PVD posterior vitreous detachment; RD retinal detachment; DM diabetes mellitus; DR diabetic retinopathy; NPDR non-proliferative diabetic retinopathy; PDR proliferative diabetic retinopathy; CSME clinically significant macular edema; DME diabetic macular edema; dbh dot blot hemorrhages; CWS cotton wool  spot; POAG primary open angle glaucoma; C/D cup-to-disc ratio; HVF humphrey visual field; GVF goldmann visual field; OCT optical coherence tomography; IOP intraocular pressure; BRVO Branch retinal vein occlusion; CRVO central retinal vein occlusion; CRAO central retinal artery occlusion; BRAO branch retinal artery occlusion; RT retinal tear; SB scleral buckle; PPV pars plana vitrectomy; VH Vitreous hemorrhage; PRP panretinal laser photocoagulation; IVK intravitreal kenalog; VMT vitreomacular traction; MH Macular hole;  NVD neovascularization of the disc; NVE neovascularization elsewhere; AREDS age related eye disease study; ARMD age related macular degeneration; POAG primary open angle glaucoma; EBMD epithelial/anterior basement membrane dystrophy; ACIOL anterior chamber intraocular lens; IOL intraocular lens; PCIOL posterior chamber intraocular lens; Phaco/IOL phacoemulsification with intraocular lens placement; [place] photorefractive keratectomy; LASIK laser assisted in situ keratomileusis; HTN hypertension; DM diabetes mellitus; COPD chronic obstructive pulmonary disease

## 2020-02-16 DIAGNOSIS — I10 Essential (primary) hypertension: Secondary | ICD-10-CM | POA: Diagnosis not present

## 2020-02-16 DIAGNOSIS — D638 Anemia in other chronic diseases classified elsewhere: Secondary | ICD-10-CM | POA: Diagnosis not present

## 2020-02-16 DIAGNOSIS — N183 Chronic kidney disease, stage 3 unspecified: Secondary | ICD-10-CM | POA: Diagnosis not present

## 2020-02-16 DIAGNOSIS — Z853 Personal history of malignant neoplasm of breast: Secondary | ICD-10-CM | POA: Diagnosis not present

## 2020-02-16 DIAGNOSIS — E785 Hyperlipidemia, unspecified: Secondary | ICD-10-CM | POA: Diagnosis not present

## 2020-02-27 DIAGNOSIS — N183 Chronic kidney disease, stage 3 unspecified: Secondary | ICD-10-CM | POA: Diagnosis not present

## 2020-02-27 DIAGNOSIS — I10 Essential (primary) hypertension: Secondary | ICD-10-CM | POA: Diagnosis not present

## 2020-02-27 DIAGNOSIS — E785 Hyperlipidemia, unspecified: Secondary | ICD-10-CM | POA: Diagnosis not present

## 2020-02-27 DIAGNOSIS — Z853 Personal history of malignant neoplasm of breast: Secondary | ICD-10-CM | POA: Diagnosis not present

## 2020-03-19 DIAGNOSIS — I10 Essential (primary) hypertension: Secondary | ICD-10-CM | POA: Diagnosis not present

## 2020-03-19 DIAGNOSIS — Z853 Personal history of malignant neoplasm of breast: Secondary | ICD-10-CM | POA: Diagnosis not present

## 2020-03-19 DIAGNOSIS — N183 Chronic kidney disease, stage 3 unspecified: Secondary | ICD-10-CM | POA: Diagnosis not present

## 2020-03-23 DIAGNOSIS — Z9283 Personal history of failed moderate sedation: Secondary | ICD-10-CM | POA: Diagnosis not present

## 2020-03-23 DIAGNOSIS — R296 Repeated falls: Secondary | ICD-10-CM | POA: Diagnosis not present

## 2020-03-23 DIAGNOSIS — Z853 Personal history of malignant neoplasm of breast: Secondary | ICD-10-CM | POA: Diagnosis not present

## 2020-03-23 DIAGNOSIS — Z043 Encounter for examination and observation following other accident: Secondary | ICD-10-CM | POA: Diagnosis not present

## 2020-03-23 DIAGNOSIS — S43015A Anterior dislocation of left humerus, initial encounter: Secondary | ICD-10-CM | POA: Diagnosis not present

## 2020-03-23 DIAGNOSIS — I10 Essential (primary) hypertension: Secondary | ICD-10-CM | POA: Diagnosis not present

## 2020-03-23 DIAGNOSIS — G8911 Acute pain due to trauma: Secondary | ICD-10-CM | POA: Diagnosis not present

## 2020-03-23 DIAGNOSIS — Z91040 Latex allergy status: Secondary | ICD-10-CM | POA: Diagnosis not present

## 2020-03-23 DIAGNOSIS — Z87891 Personal history of nicotine dependence: Secondary | ICD-10-CM | POA: Diagnosis not present

## 2020-03-23 DIAGNOSIS — S43005A Unspecified dislocation of left shoulder joint, initial encounter: Secondary | ICD-10-CM | POA: Diagnosis not present

## 2020-03-23 DIAGNOSIS — M21922 Unspecified acquired deformity of left upper arm: Secondary | ICD-10-CM | POA: Diagnosis not present

## 2020-03-23 DIAGNOSIS — M25512 Pain in left shoulder: Secondary | ICD-10-CM | POA: Diagnosis not present

## 2020-03-24 DIAGNOSIS — S43015A Anterior dislocation of left humerus, initial encounter: Secondary | ICD-10-CM | POA: Diagnosis not present

## 2020-03-26 ENCOUNTER — Ambulatory Visit (INDEPENDENT_AMBULATORY_CARE_PROVIDER_SITE_OTHER): Payer: PPO | Admitting: Orthopaedic Surgery

## 2020-03-26 ENCOUNTER — Ambulatory Visit (INDEPENDENT_AMBULATORY_CARE_PROVIDER_SITE_OTHER): Payer: PPO

## 2020-03-26 ENCOUNTER — Other Ambulatory Visit: Payer: Self-pay

## 2020-03-26 ENCOUNTER — Encounter: Payer: Self-pay | Admitting: Orthopaedic Surgery

## 2020-03-26 DIAGNOSIS — I10 Essential (primary) hypertension: Secondary | ICD-10-CM | POA: Diagnosis not present

## 2020-03-26 DIAGNOSIS — M25512 Pain in left shoulder: Secondary | ICD-10-CM

## 2020-03-26 DIAGNOSIS — S43015A Anterior dislocation of left humerus, initial encounter: Secondary | ICD-10-CM

## 2020-03-26 DIAGNOSIS — S43005A Unspecified dislocation of left shoulder joint, initial encounter: Secondary | ICD-10-CM | POA: Diagnosis not present

## 2020-03-26 NOTE — Progress Notes (Signed)
Office Visit Note   Patient: Sylvia Simpson           Date of Birth: 10/22/1945           MRN: [id number] Visit Date: 03/26/2020              Requested by: Lajean Manes, MD [address] [place],  [place] [address] PCP: Lajean Manes, MD   Assessment & Plan: Visit Diagnoses:  1. Closed anterior dislocation of left shoulder, initial encounter     Plan: Sling was adjusted today and I taught her how to wear this properly.  She will wear this at all times for the next 3 weeks.  No range of motion allowed at this time.  I explained that there is a high likelihood of rotator cuff tear but given her age we will try nonop treatment with physical therapy.  We will see her back in 3 weeks with repeat x-rays including Grashey and axillary views.  Follow-Up Instructions: Return in about 3 weeks (around 04/16/2020).   Orders:  Orders Placed This Encounter  Procedures  . XR Shoulder Left   No orders of the defined types were placed in this encounter.     Procedures: No procedures performed   Clinical Data: No additional findings.   Subjective: Chief Complaint  Patient presents with  . Left Shoulder - Pain    Sylvia Simpson is a 74 year old female follow-up from the ER from this past weekend.  She slipped on Legos and dislocated her left shoulder.  He required 2 attempts in the ER in order to reduce this fully.  She was placed in a shoulder sling immobilizer.  She follows up today for further evaluation and treatment.  Denies any numbness and tingling in her left upper extremity.   Review of Systems  Constitutional: Negative.   HENT: Negative.   Eyes: Negative.   Respiratory: Negative.   Cardiovascular: Negative.   Endocrine: Negative.   Musculoskeletal: Negative.   Neurological: Negative.   Hematological: Negative.   Psychiatric/Behavioral: Negative.   All other systems reviewed and are negative.    Objective: Vital Signs: There were no vitals taken  for this visit.  Physical Exam Vitals and nursing note reviewed.  Constitutional:      Appearance: She is well-developed.  HENT:     Head: Normocephalic and atraumatic.  Pulmonary:     Effort: Pulmonary effort is normal.  Abdominal:     Palpations: Abdomen is soft.  Musculoskeletal:     Cervical back: Neck supple.  Skin:    General: Skin is warm.     Capillary Refill: Capillary refill takes less than 2 seconds.  Neurological:     Mental Status: She is alert and oriented to person, place, and time.  Psychiatric:        Behavior: Behavior normal.        Thought Content: Thought content normal.        Judgment: Judgment normal.     Ortho Exam Left shoulder shows no asymmetry.  Neurovascular intact distally.  Range of motion not tested. Specialty Comments:  No specialty comments available.  Imaging: XR Shoulder Left  Result Date: 03/26/2020 Congruent left shoulder joint.    PMFS History: Patient Active Problem List   Diagnosis Date Noted  . Closed anterior dislocation of left shoulder 03/26/2020  . Macular hole of left eye 01/23/2020  . Chorioretinal scar 01/23/2020  . Macular hole of right eye 01/23/2020  . History of retinal  detachment 01/23/2020  . Peripheral neuropathy 12/14/2017  . Lumbar radiculopathy, right 12/14/2017   Past Medical History:  Diagnosis Date  . Anxiety   . Asthma    as child, no issues now  . Cancer [facility]) 1997   right breast cancer-lumpectomy,chemo and radiation  . Hypertension   . Lumbar radiculopathy, right 12/14/2017   L5  . Peripheral neuropathy 12/14/2017    Family History  Problem Relation Age of Onset  . Breast cancer Paternal Aunt 6    Past Surgical History:  Procedure Laterality Date  . ABDOMINAL HYSTERECTOMY    . BACK SURGERY    . BREAST EXCISIONAL BIOPSY Left 2019   benign  . BREAST LUMPECTOMY Right 1997  . CHOLECYSTECTOMY    . EYE SURGERY     retinal detachment  . RADIOACTIVE SEED GUIDED EXCISIONAL BREAST BIOPSY  Left 06/01/2017   Procedure: LEFT RADIOACTIVE SEED GUIDED EXCISIONAL BREAST BIOPSY ERAS PATHWAY;  Surgeon: [name], MD;  Location: [facility];  Service: General;  Laterality: Left;  . TUBAL LIGATION     Social History   Occupational History  . Not on file  Tobacco Use  . Smoking status: Former Research scientist (life sciences)  . Smokeless tobacco: Never Used  Substance and Sexual Activity  . Alcohol use: Yes    Comment: social  . Drug use: No  . Sexual activity: Not on file

## 2020-03-27 ENCOUNTER — Encounter: Payer: Self-pay | Admitting: Orthopaedic Surgery

## 2020-03-27 ENCOUNTER — Ambulatory Visit: Payer: PPO

## 2020-03-27 ENCOUNTER — Telehealth: Payer: Self-pay | Admitting: Orthopaedic Surgery

## 2020-03-27 DIAGNOSIS — M25512 Pain in left shoulder: Secondary | ICD-10-CM | POA: Diagnosis not present

## 2020-03-27 NOTE — Telephone Encounter (Signed)
See my chart message

## 2020-03-27 NOTE — Telephone Encounter (Signed)
Yes that's fine.  She can come in anytime to get the sling.  Thanks.

## 2020-03-27 NOTE — Telephone Encounter (Signed)
Patient called requesting a call back. Patient states to need a sling for left dislocated shoulder. Please call patient at [phone].

## 2020-04-01 DIAGNOSIS — Z853 Personal history of malignant neoplasm of breast: Secondary | ICD-10-CM | POA: Diagnosis not present

## 2020-04-01 DIAGNOSIS — N183 Chronic kidney disease, stage 3 unspecified: Secondary | ICD-10-CM | POA: Diagnosis not present

## 2020-04-01 DIAGNOSIS — E785 Hyperlipidemia, unspecified: Secondary | ICD-10-CM | POA: Diagnosis not present

## 2020-04-01 DIAGNOSIS — I10 Essential (primary) hypertension: Secondary | ICD-10-CM | POA: Diagnosis not present

## 2020-04-09 DIAGNOSIS — Z961 Presence of intraocular lens: Secondary | ICD-10-CM | POA: Diagnosis not present

## 2020-04-09 DIAGNOSIS — H31093 Other chorioretinal scars, bilateral: Secondary | ICD-10-CM | POA: Diagnosis not present

## 2020-04-09 DIAGNOSIS — Z9889 Other specified postprocedural states: Secondary | ICD-10-CM | POA: Diagnosis not present

## 2020-04-16 ENCOUNTER — Other Ambulatory Visit: Payer: Self-pay

## 2020-04-16 ENCOUNTER — Encounter: Payer: Self-pay | Admitting: Orthopaedic Surgery

## 2020-04-16 ENCOUNTER — Ambulatory Visit: Payer: PPO | Admitting: Orthopaedic Surgery

## 2020-04-16 ENCOUNTER — Ambulatory Visit (INDEPENDENT_AMBULATORY_CARE_PROVIDER_SITE_OTHER): Payer: PPO

## 2020-04-16 DIAGNOSIS — S43015A Anterior dislocation of left humerus, initial encounter: Secondary | ICD-10-CM | POA: Diagnosis not present

## 2020-04-16 NOTE — Progress Notes (Signed)
     Patient: Sylvia Simpson           Date of Birth: [birth year]           MRN: [id number] Visit Date: 04/16/2020 PCP: Merlene Laughter, MD   Assessment & Plan:  Chief Complaint:  Chief Complaint  Patient presents with  . Left Shoulder - Pain   Visit Diagnoses:  1. Closed anterior dislocation of left shoulder, initial encounter     Plan:   Sylvia Simpson is 3 weeks status post a left shoulder dislocation.  Is doing well overall.  No real complaints.  She has been wearing the sling continuously.  Left shoulder and upper extremity neurovascularly intact.  She is able to raise her arm to just above the level of the shoulder.  At this point we will send her to outpatient PT.  She should wear the sling in public and then wean as tolerated over the next month or so.  Follow-up in 6 weeks for recheck.  Follow-Up Instructions: Return in about 6 weeks (around 05/28/2020).   Orders:  Orders Placed This Encounter  Procedures  . XR Shoulder Left  . Ambulatory referral to Physical Therapy   No orders of the defined types were placed in this encounter.   Imaging: XR Shoulder Left  Result Date: 04/16/2020 Stable glenohumeral joint without any evidence of dislocation.   PMFS History: Patient Active Problem List   Diagnosis Date Noted  . Closed anterior dislocation of left shoulder 03/26/2020  . Macular hole of left eye 01/23/2020  . Chorioretinal scar 01/23/2020  . Macular hole of right eye 01/23/2020  . History of retinal detachment 01/23/2020  . Peripheral neuropathy 12/14/2017  . Lumbar radiculopathy, right 12/14/2017   Past Medical History:  Diagnosis Date  . Anxiety   . Asthma    as child, no issues now  . Cancer [facility]) 1997   right breast cancer-lumpectomy,chemo and radiation  . Hypertension   . Lumbar radiculopathy, right 12/14/2017   L5  . Peripheral neuropathy 12/14/2017    Family History  Problem Relation Age of Onset  . Breast cancer Paternal Aunt 12    Past  Surgical History:  Procedure Laterality Date  . ABDOMINAL HYSTERECTOMY    . BACK SURGERY    . BREAST EXCISIONAL BIOPSY Left 2019   benign  . BREAST LUMPECTOMY Right 1997  . CHOLECYSTECTOMY    . EYE SURGERY     retinal detachment  . RADIOACTIVE SEED GUIDED EXCISIONAL BREAST BIOPSY Left 06/01/2017   Procedure: LEFT RADIOACTIVE SEED GUIDED EXCISIONAL BREAST BIOPSY ERAS PATHWAY;  Surgeon: Almond Lint, MD;  Location: [facility];  Service: General;  Laterality: Left;  . TUBAL LIGATION     Social History   Occupational History  . Not on file  Tobacco Use  . Smoking status: Former Games developer  . Smokeless tobacco: Never Used  Substance and Sexual Activity  . Alcohol use: Yes    Comment: social  . Drug use: No  . Sexual activity: Not on file

## 2020-04-17 ENCOUNTER — Ambulatory Visit: Payer: PPO | Admitting: Rehabilitative and Restorative Service Providers"

## 2020-04-17 ENCOUNTER — Encounter: Payer: Self-pay | Admitting: Rehabilitative and Restorative Service Providers"

## 2020-04-17 DIAGNOSIS — M25512 Pain in left shoulder: Secondary | ICD-10-CM

## 2020-04-17 DIAGNOSIS — M6281 Muscle weakness (generalized): Secondary | ICD-10-CM

## 2020-04-17 DIAGNOSIS — M25612 Stiffness of left shoulder, not elsewhere classified: Secondary | ICD-10-CM | POA: Diagnosis not present

## 2020-04-17 NOTE — Patient Instructions (Signed)
Access Code: [email or web address] URL: [email or web address] Date: 04/17/2020 Prepared by: Chyrel Masson  Exercises Supine Shoulder Flexion Extension AAROM with Dowel - 2 x daily - 7 x weekly - 3 sets - 10 reps - 5 hold Supine Shoulder Flexion Extension Full Range AROM - 2 x daily - 7 x weekly - 3 sets - 10 reps Standing Shoulder Row with Anchored Resistance - 2 x daily - 7 x weekly - 3 sets - 10 reps Shoulder Extension with Resistance - 2 x daily - 7 x weekly - 3 sets - 10 reps Supine Shoulder Abduction AAROM with Dowel - 2 x daily - 7 x weekly - 3 sets - 10 reps

## 2020-04-17 NOTE — Therapy (Signed)
[facility] [address] [place], [place], [address] Phone: [phone]   Fax:  [phone]  Physical Therapy Evaluation  Patient Details  Name: Sylvia Simpson MRN: [id number] Date of Birth: 02-11-1946 Referring Provider (PT): Dr. Roda ShuttersXu   Encounter Date: 04/17/2020   PT End of Session - 04/17/20 1100    Visit Number 1    Number of Visits 12    Date for PT Re-Evaluation 06/12/20    Progress Note Due on Visit 10    PT Start Time 1105    PT Stop Time 1145    PT Time Calculation (min) 40 min    Activity Tolerance Patient tolerated treatment well    [facility] for tasks assessed/performed           Past Medical History:  Diagnosis Date  . Anxiety   . Asthma    as child, no issues now  . Cancer [facility]) 1997   right breast cancer-lumpectomy,chemo and radiation  . Hypertension   . Lumbar radiculopathy, right 12/14/2017   L5  . Peripheral neuropathy 12/14/2017    Past Surgical History:  Procedure Laterality Date  . ABDOMINAL HYSTERECTOMY    . BACK SURGERY    . BREAST EXCISIONAL BIOPSY Left 2019   benign  . BREAST LUMPECTOMY Right 1997  . CHOLECYSTECTOMY    . EYE SURGERY     retinal detachment  . RADIOACTIVE SEED GUIDED EXCISIONAL BREAST BIOPSY Left 06/01/2017   Procedure: LEFT RADIOACTIVE SEED GUIDED EXCISIONAL BREAST BIOPSY ERAS PATHWAY;  Surgeon: Almond LintByerly, Faera, MD;  Location: [facility];  Service: General;  Laterality: Left;  . TUBAL LIGATION      There were no vitals filed for this visit.    Subjective Assessment - 04/17/20 1059    Subjective Comes to clinic c Lt shoulder pain s/p anterior dislocation.  Pt. indicated she fell on legos onto Lt arm.  Pt. has been wearing sling since Dec 5th.  Pt. stated first occurence of injury to Lt shoulder.  Pt. stated feeling no pain at rest.    Patient is accompained by: Family member    Limitations House hold activities    Patient Stated Goals Improve  arm use, sleeping, get rid of sling.    Currently in Pain? No/denies    Pain Score 0-No pain   2-3/10 at worst   Pain Location Shoulder    Pain Orientation Left    Pain Descriptors / Indicators Aching;Tightness    Pain Type Acute pain    Pain Onset 1 to 4 weeks ago    Pain Frequency Occasional    Aggravating Factors  general movement    Effect of Pain on Daily Activities [facility], household activity, self care.              [facility] - 04/17/20 0001      Assessment   Medical Diagnosis Lt shoulder pain, anterior dislocation    Referring Provider (PT) Dr. Roda ShuttersXu    Onset Date/Surgical Date 03/23/20    Hand Dominance Right      Balance Screen   Has the patient fallen in the past 6 months Yes    How many times? 1    Has the patient had a decrease in activity level because of a fear of falling?  Yes      Home Environment   Living Environment Private residence    Additional Comments Flight of stairs to  enter apartment      Prior Function   Level of Independence Independent      Cognition   Overall Cognitive Status Within Functional Limits for tasks assessed      Observation/Other Assessments   Focus on Therapeutic Outcomes (FOTO)  intake 50%, expected outcome visit 11: 69      Sensation   Light Touch Appears Intact      Posture/Postural Control   Posture Comments Sling on Lt UE upon arrival, rounder shoulders bilateral      ROM / Strength   AROM / PROM / Strength Strength;PROM;AROM      AROM   Overall AROM Comments Elevation against gravity < 90 deg in flexion, scaption, abduction c discomfort    AROM Assessment Site Shoulder    Right/Left Shoulder Left;Right    Left Shoulder Flexion 90 Degrees   measured supine   Left Shoulder ABduction 85 Degrees   measured supine   Left Shoulder Internal Rotation 75 Degrees   in 45 deg abd supine   Left Shoulder External Rotation 60 Degrees   in 45 deg abduction supine     PROM   Overall  PROM Comments Measured in supine    PROM Assessment Site Shoulder    Right/Left Shoulder Left;Right    Right Shoulder Flexion 90 Degrees    Left Shoulder ABduction 90 Degrees    Left Shoulder Internal Rotation 75 Degrees    Left Shoulder External Rotation 65 Degrees      Strength   Overall Strength Comments observed in available range    Strength Assessment Site Shoulder    Right/Left Shoulder Left;Right    Right Shoulder Flexion 5/5    Right Shoulder ABduction 5/5    Right Shoulder Internal Rotation 5/5    Right Shoulder External Rotation 5/5    Left Shoulder Flexion 4-/5    Left Shoulder ABduction 4-/5    Left Shoulder Internal Rotation 5/5    Left Shoulder External Rotation 4/5                      Objective measurements completed on examination: See above findings.       [facility] Adult PT Treatment/Exercise - 04/17/20 0001      Exercises   Exercises Shoulder;Other Exercises    Other Exercises  HEP instruction/performance c cues for techniques, handout provided      Shoulder Exercises: Supine   Other Supine Exercises supine wand flexion, abd x 10 each c instruction    Other Supine Exercises supine active flexion x 10      Shoulder Exercises: Standing   Other Standing Exercises Green tband rows, gh ext to side x 10 each c instruction      Manual Therapy   Manual therapy comments g2 inferior jt mobs, prom in flexion, scaption                  PT Education - 04/17/20 1059    Education Details HEP, POC    Person(s) Educated Patient    Methods Explanation;Demonstration;Handout;Other (comment)    Comprehension Returned demonstration;Verbalized understanding            PT Short Term Goals - 04/17/20 1100      PT SHORT TERM GOAL #1   Title Patient will demonstrate independent use of home exercise program to maintain progress from in clinic treatments.    Time 3    Period Weeks    Status New    Target Date  05/08/20             PT Long  Term Goals - 04/17/20 1100      PT LONG TERM GOAL #1   Title Patient will demonstrate/report pain at worst less than or equal to 2/10 to facilitate minimal limitation in daily activity secondary to pain symptoms.    Time 8    Period Weeks    Status New    Target Date 06/12/20      PT LONG TERM GOAL #2   Title Patient will demonstrate independent use of home exercise program to facilitate ability to maintain/progress functional gains from skilled physical therapy services.    Time 8    Period Weeks    Status New    Target Date 06/12/20      PT LONG TERM GOAL #3   Title Pt. will demonstrate Lt [place] jt AROM WFL s symptoms to facilitate full range for daily activity s restriction due to symptoms.    Time 8    Period Weeks    Status New    Target Date 06/12/20      PT LONG TERM GOAL #4   Title Pt. will demonstrate Lt UE MMT 5/5 throughout to facilitate usual daily home and recreational activity at PLOF.    Time 8    Period Weeks    Status New    Target Date 06/12/20      PT LONG TERM GOAL #5   Title Pt. will reach FOTO outcome > or = 69% to indicated reduced disability 2' to condition.    Time 8    Period Weeks    Status New    Target Date 06/12/20                  Plan - 04/17/20 1101    Clinical Impression Statement Patient is a 74 y.o. female who comes to clinic with complaints of Lt shoulder pain with mobility, strength and movement coordination deficits that impair their ability to perform usual daily and recreational functional activities without increase difficulty/symptoms at this time.  Patient to benefit from skilled PT services to address impairments and limitations to improve to previous level of function without restriction secondary to condition.    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   Stability/Clinical Decision Making Stable/Uncomplicated    Clinical Decision Making Low    Rehab Potential Good    PT Frequency --   1-2x/week   PT Duration 8 weeks    PT Treatment/Interventions ADLs/Self Care Home Management;Cryotherapy;Electrical Stimulation;Iontophoresis 4mg /ml Dexamethasone;Moist Heat;Balance training;Therapeutic exercise;Therapeutic activities;Functional mobility training;Stair training;Gait training;Ultrasound;Neuromuscular re-education;Patient/family education;Manual techniques;Dry needling;Passive range of motion;Spinal Manipulations;Joint Manipulations    PT Next Visit Plan Reassess HEP use, progress mobility and strength as tolerated, avoid working through shrug    PT Home Exercise Plan [email or web address]    Consulted and Agree with Plan of Care Patient           Patient will benefit from skilled therapeutic intervention in order to improve the following deficits and impairments:  Hypomobility,Pain,Impaired UE functional use,Decreased strength,Decreased activity tolerance,Decreased mobility,Difficulty walking,Impaired perceived functional ability,Improper body mechanics,Impaired flexibility,Decreased range of motion,Decreased coordination  Visit Diagnosis: Left shoulder pain, unspecified chronicity  Muscle weakness (generalized)  Stiffness of left shoulder, not elsewhere classified     Problem List Patient Active Problem List  Diagnosis Date Noted  . Closed anterior dislocation of left shoulder 03/26/2020  . Macular hole of left eye 01/23/2020  . Chorioretinal scar 01/23/2020  . Macular hole of right eye 01/23/2020  . History of retinal detachment 01/23/2020  . Peripheral neuropathy 12/14/2017  . Lumbar radiculopathy, right 12/14/2017    Scot Jun, PT, DPT, OCS, ATC 04/17/20  11:57 AM    [facility] Physical Therapy [address] [place], [place], [address] Phone:  [phone]   Fax:  [phone]  Name: Sylvia Simpson MRN: [id number] Date of Birth: 03-Apr-1946

## 2020-04-29 ENCOUNTER — Ambulatory Visit: Payer: PPO | Admitting: Rehabilitative and Restorative Service Providers"

## 2020-04-29 ENCOUNTER — Other Ambulatory Visit: Payer: Self-pay

## 2020-04-29 ENCOUNTER — Encounter: Payer: Self-pay | Admitting: Rehabilitative and Restorative Service Providers"

## 2020-04-29 DIAGNOSIS — M6281 Muscle weakness (generalized): Secondary | ICD-10-CM

## 2020-04-29 DIAGNOSIS — M25612 Stiffness of left shoulder, not elsewhere classified: Secondary | ICD-10-CM

## 2020-04-29 DIAGNOSIS — M25512 Pain in left shoulder: Secondary | ICD-10-CM | POA: Diagnosis not present

## 2020-04-29 NOTE — Therapy (Signed)
[place], [place], [address] Phone: [phone]   Fax:  [phone]  Physical Therapy Treatment  Patient Details  Name: Sylvia Simpson MRN: [id number] Date of Birth: 1945-11-10 Referring Provider (PT): Dr. Erlinda Hong   Encounter Date: 04/29/2020   PT End of Session - 04/29/20 1301    Visit Number 2    Number of Visits 12    Date for PT Re-Evaluation 06/12/20    Progress Note Due on Visit 10    PT Start Time 1300    PT Stop Time 1340    PT Time Calculation (min) 40 min    Activity Tolerance Patient tolerated treatment well    Behavior During Therapy [facility] for tasks assessed/performed           Past Medical History:  Diagnosis Date  . Anxiety   . Asthma    as child, no issues now  . Cancer [facility]) 1997   right breast cancer-lumpectomy,chemo and radiation  . Hypertension   . Lumbar radiculopathy, right 12/14/2017   L5  . Peripheral neuropathy 12/14/2017    Past Surgical History:  Procedure Laterality Date  . ABDOMINAL HYSTERECTOMY    . BACK SURGERY    . BREAST EXCISIONAL BIOPSY Left 2019   benign  . BREAST LUMPECTOMY Right 1997  . CHOLECYSTECTOMY    . EYE SURGERY     retinal detachment  . RADIOACTIVE SEED GUIDED EXCISIONAL BREAST BIOPSY Left 06/01/2017   Procedure: LEFT RADIOACTIVE SEED GUIDED EXCISIONAL BREAST BIOPSY ERAS PATHWAY;  Surgeon: [name], MD;  Location: [place];  Service: General;  Laterality: Left;  . TUBAL LIGATION      There were no vitals filed for this visit.   Subjective Assessment - 04/29/20 1302    Subjective Pt. stated feeling like things are going better.  Pt. stated no pain at rest today.    Patient is accompained by: Family member    Limitations House hold activities    Patient Stated Goals Improve arm use, sleeping, get rid of sling.    Currently in Pain? No/denies    Pain Onset 1 to 4 weeks ago                             [facility]  Adult PT Treatment/Exercise - 04/29/20 0001      Neuro Re-ed    Neuro Re-ed Details  rhythmic stabilizations in 100 deg flexion c mild resistance 30 sec bouts      Shoulder Exercises: Supine   Horizontal ABduction Both;Strengthening;Theraband   green 3 x 10     Shoulder Exercises: Sidelying   External Rotation Left;20 reps   various reps c isometric hold against resistance   Flexion Left   2 x 15   ABduction Left   2 x 15     Shoulder Exercises: ROM/Strengthening   UBE (Upper Arm Bike) Lvl 2 3 mins fwd/back each way                    PT Short Term Goals - 04/29/20 1303      PT SHORT TERM GOAL #1   Title Patient will demonstrate independent use of home exercise program to maintain progress from in clinic treatments.    Time 3    Period Weeks    Status On-going    Target Date 05/08/20  PT Long Term Goals - 04/17/20 1100      PT LONG TERM GOAL #1   Title Patient will demonstrate/report pain at worst less than or equal to 2/10 to facilitate minimal limitation in daily activity secondary to pain symptoms.    Time 8    Period Weeks    Status New    Target Date 06/12/20      PT LONG TERM GOAL #2   Title Patient will demonstrate independent use of home exercise program to facilitate ability to maintain/progress functional gains from skilled physical therapy services.    Time 8    Period Weeks    Status New    Target Date 06/12/20      PT LONG TERM GOAL #3   Title Pt. will demonstrate Lt [place] jt AROM WFL s symptoms to facilitate full range for daily activity s restriction due to symptoms.    Time 8    Period Weeks    Status New    Target Date 06/12/20      PT LONG TERM GOAL #4   Title Pt. will demonstrate Lt UE MMT 5/5 throughout to facilitate usual daily home and recreational activity at PLOF.    Time 8    Period Weeks    Status New    Target Date 06/12/20      PT LONG TERM GOAL #5   Title Pt. will reach FOTO outcome > or = 69% to indicated  reduced disability 2' to condition.    Time 8    Period Weeks    Status New    Target Date 06/12/20                 Plan - 04/29/20 1315    Clinical Impression Statement Minimal complaints in active and passive movement assessment today, close to full [facility] movement at this time.  Swtiched intervention in clinic and HEP to focus on activation and strengthening in various [place] jt motions.    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   Stability/Clinical Decision Making Stable/Uncomplicated    Rehab Potential Good    PT Frequency --   1-2x/week   PT Duration 8 weeks    PT Treatment/Interventions ADLs/Self Care Home Management;Cryotherapy;Electrical Stimulation;Iontophoresis 4mg /ml Dexamethasone;Moist Heat;Balance training;Therapeutic exercise;Therapeutic activities;Functional mobility training;Stair training;Gait training;Ultrasound;Neuromuscular re-education;Patient/family education;Manual techniques;Dry needling;Passive range of motion;Spinal Manipulations;Joint Manipulations    PT Next Visit Plan Continue strengthening and neuromuscular control intervention for Lt UE.    PT Home Exercise Plan [email or web address]    Consulted and Agree with Plan of Care Patient           Patient will benefit from skilled therapeutic intervention in order to improve the following deficits and impairments:  Hypomobility,Pain,Impaired UE functional use,Decreased strength,Decreased activity tolerance,Decreased mobility,Difficulty walking,Impaired perceived functional ability,Improper body mechanics,Impaired flexibility,Decreased range of motion,Decreased coordination  Visit Diagnosis: Left shoulder pain, unspecified chronicity  Muscle weakness (generalized)  Stiffness of left shoulder, not elsewhere  classified     Problem List Patient Active Problem List   Diagnosis Date Noted  . Closed anterior dislocation of left shoulder 03/26/2020  . Macular hole of left eye 01/23/2020  . Chorioretinal scar 01/23/2020  . Macular hole of right eye 01/23/2020  . History of retinal detachment 01/23/2020  . Peripheral neuropathy 12/14/2017  . Lumbar radiculopathy, right 12/14/2017    Scot Jun,  PT, DPT, OCS, ATC 04/29/20  1:33 PM    [facility] [address] [place], [place], [address] Phone: [phone]   Fax:  [phone]  Name: Sylvia Simpson MRN: [id number] Date of Birth: 11-28-1945

## 2020-04-29 NOTE — Patient Instructions (Signed)
Access Code: [email or web address] URL: [email or web address] Date: 04/29/2020 Prepared by: Scot Jun  Exercises Supine Shoulder Flexion Extension AAROM with Dowel - 2 x daily - 7 x weekly - 3 sets - 10 reps - 5 hold Standing Shoulder Row with Anchored Resistance - 2 x daily - 7 x weekly - 3 sets - 10 reps Shoulder Extension with Resistance - 2 x daily - 7 x weekly - 3 sets - 10 reps Supine Shoulder Horizontal Abduction with Resistance - 2 x daily - 7 x weekly - 10 reps - 3 sets Sidelying Shoulder Flexion 15 Degrees - 2 x daily - 7 x weekly - 3 sets - 10-15 reps Sidelying Shoulder Abduction Palm Forward - 1 x daily - 7 x weekly - 3 sets - 10-15 reps Sidelying Shoulder ER with Towel and Dumbbell - 2 x daily - 7 x weekly - 3 sets - 10-15 reps

## 2020-04-30 DIAGNOSIS — I10 Essential (primary) hypertension: Secondary | ICD-10-CM | POA: Diagnosis not present

## 2020-04-30 DIAGNOSIS — N183 Chronic kidney disease, stage 3 unspecified: Secondary | ICD-10-CM | POA: Diagnosis not present

## 2020-04-30 DIAGNOSIS — Z853 Personal history of malignant neoplasm of breast: Secondary | ICD-10-CM | POA: Diagnosis not present

## 2020-05-01 ENCOUNTER — Encounter: Payer: Self-pay | Admitting: Rehabilitative and Restorative Service Providers"

## 2020-05-01 ENCOUNTER — Ambulatory Visit: Payer: PPO | Admitting: Rehabilitative and Restorative Service Providers"

## 2020-05-01 ENCOUNTER — Other Ambulatory Visit: Payer: Self-pay

## 2020-05-01 DIAGNOSIS — M25612 Stiffness of left shoulder, not elsewhere classified: Secondary | ICD-10-CM | POA: Diagnosis not present

## 2020-05-01 DIAGNOSIS — M25512 Pain in left shoulder: Secondary | ICD-10-CM

## 2020-05-01 DIAGNOSIS — M6281 Muscle weakness (generalized): Secondary | ICD-10-CM

## 2020-05-01 NOTE — Therapy (Signed)
[place], [place], [address] Phone: [phone]   Fax:  [phone]  Physical Therapy Treatment  Patient Details  Name: Sylvia Simpson MRN: [id number] Date of Birth: [birth year] Referring Provider (PT): Dr. Erlinda Hong   Encounter Date: 05/01/2020   PT End of Session - 05/01/20 1304    Visit Number 3    Number of Visits 12    Date for PT Re-Evaluation 06/12/20    Progress Note Due on Visit 10    PT Start Time [id number]    PT Stop Time 1338    PT Time Calculation (min) 39 min    Activity Tolerance Patient tolerated treatment well    Behavior During Therapy [facility] for tasks assessed/performed           Past Medical History:  Diagnosis Date  . Anxiety   . Asthma    as child, no issues now  . Cancer [facility]) 1997   right breast cancer-lumpectomy,chemo and radiation  . Hypertension   . Lumbar radiculopathy, right 12/14/2017   L5  . Peripheral neuropathy 12/14/2017    Past Surgical History:  Procedure Laterality Date  . ABDOMINAL HYSTERECTOMY    . BACK SURGERY    . BREAST EXCISIONAL BIOPSY Left 2019   benign  . BREAST LUMPECTOMY Right 1997  . CHOLECYSTECTOMY    . EYE SURGERY     retinal detachment  . RADIOACTIVE SEED GUIDED EXCISIONAL BREAST BIOPSY Left 06/01/2017   Procedure: LEFT RADIOACTIVE SEED GUIDED EXCISIONAL BREAST BIOPSY ERAS PATHWAY;  Surgeon: [name], MD;  Location: [place];  Service: General;  Laterality: Left;  . TUBAL LIGATION      There were no vitals filed for this visit.   Subjective Assessment - 05/01/20 1303    Subjective Pt. indicated feeling better than she thought she would.  Tried doing some of the new exercises and some classes at gym since last visit and felt pretty good.    Patient is accompained by: Family member    Limitations House hold activities    Patient Stated Goals Improve arm use, sleeping, get rid of sling.    Currently in Pain? No/denies    Pain Score  0-No pain    Pain Onset 1 to 4 weeks ago                             [facility] Adult PT Treatment/Exercise - 05/01/20 0001      Shoulder Exercises: Supine   Protraction 10 reps   5 sec hold   Protraction Weight (lbs) 5      Shoulder Exercises: Sidelying   External Rotation Left   3 x 10   External Rotation Weight (lbs) 2    Flexion Left   3 x 10 (2x10 no weight)   Flexion Weight (lbs) 1   first set   ABduction Left   3  x10   ABduction Weight (lbs) 1      Shoulder Exercises: Standing   Other Standing Exercises blue band 3 x 15 gh ext    Other Standing Exercises wall push ups 3 x 10      Shoulder Exercises: ROM/Strengthening   UBE (Upper Arm Bike) Lvl 3.0 2 mins fwd/back each way    Cybex Row Other (comment)   3 x 10 10 lbs                   PT  Short Term Goals - 04/29/20 1303      PT SHORT TERM GOAL #1   Title Patient will demonstrate independent use of home exercise program to maintain progress from in clinic treatments.    Time 3    Period Weeks    Status On-going    Target Date 05/08/20             PT Long Term Goals - 04/17/20 1100      PT LONG TERM GOAL #1   Title Patient will demonstrate/report pain at worst less than or equal to 2/10 to facilitate minimal limitation in daily activity secondary to pain symptoms.    Time 8    Period Weeks    Status New    Target Date 06/12/20      PT LONG TERM GOAL #2   Title Patient will demonstrate independent use of home exercise program to facilitate ability to maintain/progress functional gains from skilled physical therapy services.    Time 8    Period Weeks    Status New    Target Date 06/12/20      PT LONG TERM GOAL #3   Title Pt. will demonstrate Lt [place] jt AROM WFL s symptoms to facilitate full range for daily activity s restriction due to symptoms.    Time 8    Period Weeks    Status New    Target Date 06/12/20      PT LONG TERM GOAL #4   Title Pt. will demonstrate Lt UE MMT 5/5  throughout to facilitate usual daily home and recreational activity at PLOF.    Time 8    Period Weeks    Status New    Target Date 06/12/20      PT LONG TERM GOAL #5   Title Pt. will reach FOTO outcome > or = 69% to indicated reduced disability 2' to condition.    Time 8    Period Weeks    Status New    Target Date 06/12/20                 Plan - 05/01/20 1326    Clinical Impression Statement Progression in resistance produced fatigue but overall good performance.  Elevation against gravity to approx. 110 deg c mild shrug noted in mid to end range on Lt UE.    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   Stability/Clinical Decision Making Stable/Uncomplicated    Rehab Potential Good    PT Frequency --   1-2x/week   PT Duration 8 weeks    PT Treatment/Interventions ADLs/Self Care Home Management;Cryotherapy;Electrical Stimulation;Iontophoresis 4mg /ml Dexamethasone;Moist Heat;Balance training;Therapeutic exercise;Therapeutic activities;Functional mobility training;Stair training;Gait training;Ultrasound;Neuromuscular re-education;Patient/family education;Manual techniques;Dry needling;Passive range of motion;Spinal Manipulations;Joint Manipulations    PT Next Visit Plan Continue strengthening and neuromuscular control intervention for Lt UE.    PT Home Exercise Plan [email or web address]    Consulted and Agree with Plan of Care Patient           Patient will benefit from skilled therapeutic intervention in order to improve the following deficits and impairments:  Hypomobility,Pain,Impaired UE functional use,Decreased strength,Decreased activity tolerance,Decreased mobility,Difficulty walking,Impaired perceived functional ability,Improper body mechanics,Impaired  flexibility,Decreased range of motion,Decreased coordination  Visit Diagnosis: Left shoulder pain, unspecified chronicity  Muscle weakness (generalized)  Stiffness of left shoulder, not elsewhere classified     Problem List  Patient Active Problem List   Diagnosis Date Noted  . Closed anterior dislocation of left shoulder 03/26/2020  . Macular hole of left eye 01/23/2020  . Chorioretinal scar 01/23/2020  . Macular hole of right eye 01/23/2020  . History of retinal detachment 01/23/2020  . Peripheral neuropathy 12/14/2017  . Lumbar radiculopathy, right 12/14/2017    Scot Jun, PT, DPT, OCS, ATC 05/01/20  1:28 PM    [facility] [address] [place], [place], [address] Phone: [phone]   Fax:  [phone]  Name: Sylvia Simpson MRN: [id number] Date of Birth: July 05, 1945

## 2020-05-06 ENCOUNTER — Encounter: Payer: PPO | Admitting: Rehabilitative and Restorative Service Providers"

## 2020-05-08 ENCOUNTER — Ambulatory Visit: Payer: PPO | Admitting: Rehabilitative and Restorative Service Providers"

## 2020-05-08 ENCOUNTER — Encounter: Payer: Self-pay | Admitting: Rehabilitative and Restorative Service Providers"

## 2020-05-08 ENCOUNTER — Other Ambulatory Visit: Payer: Self-pay

## 2020-05-08 DIAGNOSIS — M25512 Pain in left shoulder: Secondary | ICD-10-CM

## 2020-05-08 DIAGNOSIS — M25612 Stiffness of left shoulder, not elsewhere classified: Secondary | ICD-10-CM

## 2020-05-08 DIAGNOSIS — M6281 Muscle weakness (generalized): Secondary | ICD-10-CM

## 2020-05-08 NOTE — Therapy (Signed)
[place], [place], [address] Phone: [phone]   Fax:  [phone]  Physical Therapy Treatment  Patient Details  Name: Sylvia Simpson MRN: [id number] Date of Birth: [birth year] Referring Provider (PT): Dr. Erlinda Hong   Encounter Date: 05/08/2020   PT End of Session - 05/08/20 1257    Visit Number 4    Number of Visits 12    Date for PT Re-Evaluation 06/12/20    Progress Note Due on Visit 10    PT Start Time 9417    PT Stop Time 1339    PT Time Calculation (min) 41 min    Activity Tolerance Patient tolerated treatment well    Behavior During Therapy [facility] for tasks assessed/performed           Past Medical History:  Diagnosis Date  . Anxiety   . Asthma    as child, no issues now  . Cancer [facility]) 1997   right breast cancer-lumpectomy,chemo and radiation  . Hypertension   . Lumbar radiculopathy, right 12/14/2017   L5  . Peripheral neuropathy 12/14/2017    Past Surgical History:  Procedure Laterality Date  . ABDOMINAL HYSTERECTOMY    . BACK SURGERY    . BREAST EXCISIONAL BIOPSY Left 2019   benign  . BREAST LUMPECTOMY Right 1997  . CHOLECYSTECTOMY    . EYE SURGERY     retinal detachment  . RADIOACTIVE SEED GUIDED EXCISIONAL BREAST BIOPSY Left 06/01/2017   Procedure: LEFT RADIOACTIVE SEED GUIDED EXCISIONAL BREAST BIOPSY ERAS PATHWAY;  Surgeon: [name], MD;  Location: [place];  Service: General;  Laterality: Left;  . TUBAL LIGATION      There were no vitals filed for this visit.   Subjective Assessment - 05/08/20 1303    Subjective Pt. stated no specific complaints overall.    Patient is accompained by: Family member    Limitations House hold activities    Patient Stated Goals Improve arm use, sleeping, get rid of sling.    Currently in Pain? No/denies    Pain Score 0-No pain    Pain Onset 1 to 4 weeks ago                             [facility] Adult PT  Treatment/Exercise - 05/08/20 0001      Shoulder Exercises: Prone   Other Prone Exercises prone y x 15, t 2 x 15      Shoulder Exercises: Standing   Flexion 20 reps;Both   0-90 degrees   Other Standing Exercises UE ranger flexion, scaption x 20    Other Standing Exercises standing rows 3 x 15, blue band ER in neutral reactive isometric walk out x 15      Shoulder Exercises: ROM/Strengthening   UBE (Upper Arm Bike) Lvl 3 3 mins fwd/back each way                    PT Short Term Goals - 05/08/20 1333      PT SHORT TERM GOAL #1   Title Patient will demonstrate independent use of home exercise program to maintain progress from in clinic treatments.    Time 3    Period Weeks    Status Achieved    Target Date 05/08/20             PT Long Term Goals - 04/17/20 1100      PT LONG TERM GOAL #  1   Title Patient will demonstrate/report pain at worst less than or equal to 2/10 to facilitate minimal limitation in daily activity secondary to pain symptoms.    Time 8    Period Weeks    Status New    Target Date 06/12/20      PT LONG TERM GOAL #2   Title Patient will demonstrate independent use of home exercise program to facilitate ability to maintain/progress functional gains from skilled physical therapy services.    Time 8    Period Weeks    Status New    Target Date 06/12/20      PT LONG TERM GOAL #3   Title Pt. will demonstrate Lt [place] jt AROM WFL s symptoms to facilitate full range for daily activity s restriction due to symptoms.    Time 8    Period Weeks    Status New    Target Date 06/12/20      PT LONG TERM GOAL #4   Title Pt. will demonstrate Lt UE MMT 5/5 throughout to facilitate usual daily home and recreational activity at PLOF.    Time 8    Period Weeks    Status New    Target Date 06/12/20      PT LONG TERM GOAL #5   Title Pt. will reach FOTO outcome > or = 69% to indicated reduced disability 2' to condition.    Time 8    Period Weeks    Status  New    Target Date 06/12/20                 Plan - 05/08/20 1317    Clinical Impression Statement Elevation to approx. 90 deg today prior to shrug noted.  Total aarom/prom continued to show well in elevation attempts.    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   Stability/Clinical Decision Making Stable/Uncomplicated    Rehab Potential Good    PT Frequency --   1-2x/week   PT Duration 8 weeks    PT Treatment/Interventions ADLs/Self Care Home Management;Cryotherapy;Electrical Stimulation;Iontophoresis 4mg /ml Dexamethasone;Moist Heat;Balance training;Therapeutic exercise;Therapeutic activities;Functional mobility training;Stair training;Gait training;Ultrasound;Neuromuscular re-education;Patient/family education;Manual techniques;Dry needling;Passive range of motion;Spinal Manipulations;Joint Manipulations    PT Next Visit Plan Continue strengthening and neuromuscular control intervention for Lt UE, transitioning into gravity based resistance in elevation c no shrug sign.    PT Home Exercise Plan [id number]    Consulted and Agree with Plan of Care Patient           Patient will benefit from skilled therapeutic intervention in order to improve the following deficits and impairments:  Hypomobility,Pain,Impaired UE functional use,Decreased strength,Decreased activity tolerance,Decreased mobility,Difficulty walking,Impaired perceived functional ability,Improper body mechanics,Impaired flexibility,Decreased range of motion,Decreased coordination  Visit Diagnosis: Left shoulder pain, unspecified chronicity  Muscle weakness (generalized)  Stiffness of left shoulder, not elsewhere classified     Problem List Patient Active Problem List   Diagnosis Date Noted  . Closed  anterior dislocation of left shoulder 03/26/2020  . Macular hole of left eye 01/23/2020  . Chorioretinal scar 01/23/2020  . Macular hole of right eye 01/23/2020  . History of retinal detachment 01/23/2020  . Peripheral neuropathy 12/14/2017  . Lumbar radiculopathy, right 12/14/2017    Scot Jun, PT, DPT, OCS, ATC 05/08/20  1:35 PM    [facility] [place],  [place], [address] Phone: [phone]   Fax:  [phone]  Name: CHRISTANNA HOPEWELL MRN: [id number] Date of Birth: [birth year]

## 2020-05-13 ENCOUNTER — Ambulatory Visit: Payer: PPO | Admitting: Rehabilitative and Restorative Service Providers"

## 2020-05-13 ENCOUNTER — Other Ambulatory Visit: Payer: Self-pay

## 2020-05-13 ENCOUNTER — Encounter: Payer: Self-pay | Admitting: Rehabilitative and Restorative Service Providers"

## 2020-05-13 DIAGNOSIS — M25612 Stiffness of left shoulder, not elsewhere classified: Secondary | ICD-10-CM

## 2020-05-13 DIAGNOSIS — M25512 Pain in left shoulder: Secondary | ICD-10-CM | POA: Diagnosis not present

## 2020-05-13 DIAGNOSIS — M6281 Muscle weakness (generalized): Secondary | ICD-10-CM | POA: Diagnosis not present

## 2020-05-13 NOTE — Therapy (Signed)
[place], [place], [address] Phone: [phone]   Fax:  [phone]  Physical Therapy Treatment  Patient Details  Name: Sylvia Simpson MRN: [id number] Date of Birth: 1945-10-02 Referring Provider (PT): Dr. Erlinda Hong   Encounter Date: 05/13/2020   PT End of Session - 05/13/20 1307    Visit Number 5    Number of Visits 12    Date for PT Re-Evaluation 06/12/20    Progress Note Due on Visit 10    PT Start Time 1300    PT Stop Time 1339    PT Time Calculation (min) 39 min    Activity Tolerance Patient tolerated treatment well    Behavior During Therapy [facility] for tasks assessed/performed           Past Medical History:  Diagnosis Date  . Anxiety   . Asthma    as child, no issues now  . Cancer [facility]) 1997   right breast cancer-lumpectomy,chemo and radiation  . Hypertension   . Lumbar radiculopathy, right 12/14/2017   L5  . Peripheral neuropathy 12/14/2017    Past Surgical History:  Procedure Laterality Date  . ABDOMINAL HYSTERECTOMY    . BACK SURGERY    . BREAST EXCISIONAL BIOPSY Left 2019   benign  . BREAST LUMPECTOMY Right 1997  . CHOLECYSTECTOMY    . EYE SURGERY     retinal detachment  . RADIOACTIVE SEED GUIDED EXCISIONAL BREAST BIOPSY Left 06/01/2017   Procedure: LEFT RADIOACTIVE SEED GUIDED EXCISIONAL BREAST BIOPSY ERAS PATHWAY;  Surgeon: [name], MD;  Location: [place];  Service: General;  Laterality: Left;  . TUBAL LIGATION      There were no vitals filed for this visit.   Subjective Assessment - 05/13/20 1302    Subjective Pt. stated feeling tired from workout at y this morning but overall no pain upon arrival.    Patient is accompained by: Family member    Limitations House hold activities    Patient Stated Goals Improve arm use, sleeping, get rid of sling.    Currently in Pain? No/denies    Pain Onset 1 to 4 weeks ago              [facility] PT Assessment - 05/13/20  0001      Assessment   Medical Diagnosis Lt shoulder pain, anterior dislocation    Referring Provider (PT) Dr. Erlinda Hong    Onset Date/Surgical Date 03/23/20    Hand Dominance Right      AROM   Left Shoulder Flexion 150 Degrees   supine   Left Shoulder ABduction 140 Degrees   supine   Left Shoulder Internal Rotation 80 Degrees   supine in 45 deg abd   Left Shoulder External Rotation 75 Degrees   supine in 45 deg abd                        [facility] Adult PT Treatment/Exercise - 05/13/20 0001      Shoulder Exercises: Sidelying   Flexion Left   2 x 15   Flexion Weight (lbs) 1    ABduction Left   2 x 15   ABduction Weight (lbs) 2      Shoulder Exercises: Standing   Other Standing Exercises wall push up plus x 10, standing wall scaption c lift off x 10    Other Standing Exercises blue band reactive ER hold walk outs x 15      Shoulder  Exercises: ROM/Strengthening   UBE (Upper Arm Bike) Lvl 3.5 3 mins fwd/back each way                    PT Short Term Goals - 05/08/20 1333      PT SHORT TERM GOAL #1   Title Patient will demonstrate independent use of home exercise program to maintain progress from in clinic treatments.    Time 3    Period Weeks    Status Achieved    Target Date 05/08/20             PT Long Term Goals - 05/13/20 1304      PT LONG TERM GOAL #1   Title Patient will demonstrate/report pain at worst less than or equal to 2/10 to facilitate minimal limitation in daily activity secondary to pain symptoms.    Time 8    Period Weeks    Status On-going    Target Date 06/12/20      PT LONG TERM GOAL #2   Title Patient will demonstrate independent use of home exercise program to facilitate ability to maintain/progress functional gains from skilled physical therapy services.    Time 8    Period Weeks    Status On-going    Target Date 06/12/20      PT LONG TERM GOAL #3   Title Pt. will demonstrate Lt [place] jt AROM WFL s symptoms to facilitate  full range for daily activity s restriction due to symptoms.    Time 8    Period Weeks    Status On-going    Target Date 06/12/20      PT LONG TERM GOAL #4   Title Pt. will demonstrate Lt UE MMT 5/5 throughout to facilitate usual daily home and recreational activity at PLOF.    Time 8    Period Weeks    Status On-going    Target Date 06/12/20      PT LONG TERM GOAL #5   Title Pt. will reach FOTO outcome > or = 69% to indicated reduced disability 2' to condition.    Time 8    Period Weeks    Status On-going    Target Date 06/12/20                 Plan - 05/13/20 1305    Clinical Impression Statement See objective data for updated AROM measurements noted today (gains noted from evaluation).  Continued strengthening warranted at this time to continue progression towards goals and return to functional level s restriction.    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   Stability/Clinical Decision Making Stable/Uncomplicated    Rehab Potential Good    PT Frequency --   1-2x/week   PT Duration 8 weeks    PT Treatment/Interventions ADLs/Self Care Home Management;Cryotherapy;Electrical Stimulation;Iontophoresis 4mg /ml Dexamethasone;Moist Heat;Balance training;Therapeutic exercise;Therapeutic activities;Functional mobility training;Stair training;Gait training;Ultrasound;Neuromuscular re-education;Patient/family education;Manual techniques;Dry needling;Passive range of motion;Spinal Manipulations;Joint Manipulations    PT Next Visit Plan Continue strengthening and neuromuscular control intervention for Lt UE, transitioning into gravity based resistance in elevation c no shrug sign.    PT Home Exercise Plan [id number]    Consulted and Agree with Plan of Care Patient            Patient will benefit from skilled therapeutic intervention in order to improve the following  deficits and impairments:  Hypomobility,Pain,Impaired UE functional use,Decreased strength,Decreased activity tolerance,Decreased mobility,Difficulty walking,Impaired perceived functional ability,Improper body mechanics,Impaired flexibility,Decreased range of motion,Decreased coordination  Visit Diagnosis: Left shoulder pain, unspecified chronicity  Muscle weakness (generalized)  Stiffness of left shoulder, not elsewhere classified     Problem List Patient Active Problem List   Diagnosis Date Noted  . Closed anterior dislocation of left shoulder 03/26/2020  . Macular hole of left eye 01/23/2020  . Chorioretinal scar 01/23/2020  . Macular hole of right eye 01/23/2020  . History of retinal detachment 01/23/2020  . Peripheral neuropathy 12/14/2017  . Lumbar radiculopathy, right 12/14/2017    Scot Jun, PT, DPT, OCS, ATC 05/13/20  1:32 PM    [facility] [address] [place], [place], [address] Phone: [phone]   Fax:  [phone]  Name: Sylvia Simpson MRN: [id number] Date of Birth: Mar 16, 1946

## 2020-05-15 ENCOUNTER — Ambulatory Visit: Payer: PPO | Admitting: Rehabilitative and Restorative Service Providers"

## 2020-05-15 ENCOUNTER — Encounter: Payer: Self-pay | Admitting: Rehabilitative and Restorative Service Providers"

## 2020-05-15 ENCOUNTER — Other Ambulatory Visit: Payer: Self-pay

## 2020-05-15 DIAGNOSIS — M25512 Pain in left shoulder: Secondary | ICD-10-CM | POA: Diagnosis not present

## 2020-05-15 DIAGNOSIS — M6281 Muscle weakness (generalized): Secondary | ICD-10-CM

## 2020-05-15 DIAGNOSIS — M25612 Stiffness of left shoulder, not elsewhere classified: Secondary | ICD-10-CM | POA: Diagnosis not present

## 2020-05-15 NOTE — Therapy (Signed)
[place], [place], [address] Phone: [phone]   Fax:  [phone]  Physical Therapy Treatment  Patient Details  Name: Sylvia Simpson MRN: [id number] Date of Birth: 05/11/1945 Referring Provider (PT): Dr. Erlinda Hong   Encounter Date: 05/15/2020   PT End of Session - 05/15/20 1304    Visit Number 6    Number of Visits 12    Date for PT Re-Evaluation 06/12/20    Progress Note Due on Visit 10    PT Start Time 1300    PT Stop Time 1340    PT Time Calculation (min) 40 min    Activity Tolerance Patient tolerated treatment well    Behavior During Therapy [facility] for tasks assessed/performed           Past Medical History:  Diagnosis Date  . Anxiety   . Asthma    as child, no issues now  . Cancer [facility]) 1997   right breast cancer-lumpectomy,chemo and radiation  . Hypertension   . Lumbar radiculopathy, right 12/14/2017   L5  . Peripheral neuropathy 12/14/2017    Past Surgical History:  Procedure Laterality Date  . ABDOMINAL HYSTERECTOMY    . BACK SURGERY    . BREAST EXCISIONAL BIOPSY Left 2019   benign  . BREAST LUMPECTOMY Right 1997  . CHOLECYSTECTOMY    . EYE SURGERY     retinal detachment  . RADIOACTIVE SEED GUIDED EXCISIONAL BREAST BIOPSY Left 06/01/2017   Procedure: LEFT RADIOACTIVE SEED GUIDED EXCISIONAL BREAST BIOPSY ERAS PATHWAY;  Surgeon: [name], MD;  Location: [place];  Service: General;  Laterality: Left;  . TUBAL LIGATION      There were no vitals filed for this visit.   Subjective Assessment - 05/15/20 1304    Subjective Pt. reported some muscle tired feeling, but not pain.    Patient is accompained by: Family member    Limitations House hold activities    Patient Stated Goals Improve arm use, sleeping, get rid of sling.    Currently in Pain? No/denies    Pain Onset 1 to 4 weeks ago                             [facility] Adult PT Treatment/Exercise -  05/15/20 0001      Neuro Re-ed    Neuro Re-ed Details  rhythmic stabilizations in 100 deg flexion, ER/IR in 45 deg abd      Shoulder Exercises: Supine   Other Supine Exercises horizontal abduction green band 3 x 10      Shoulder Exercises: Standing   Flexion Both   2 x 15,  2 lb bar   Other Standing Exercises standing wall scaption c lift off x15 bilateral, 90 deg 2 lb ball clockwise circles, ccw circles x 25 each way    Other Standing Exercises standing 2 lb bar lift up back bilateral x 15      Shoulder Exercises: ROM/Strengthening   UBE (Upper Arm Bike) Lvl 3 3 mins fwd/back each way                    PT Short Term Goals - 05/08/20 1333      PT SHORT TERM GOAL #1   Title Patient will demonstrate independent use of home exercise program to maintain progress from in clinic treatments.    Time 3    Period Weeks    Status Achieved  Target Date 05/08/20             PT Long Term Goals - 05/13/20 1304      PT LONG TERM GOAL #1   Title Patient will demonstrate/report pain at worst less than or equal to 2/10 to facilitate minimal limitation in daily activity secondary to pain symptoms.    Time 8    Period Weeks    Status On-going    Target Date 06/12/20      PT LONG TERM GOAL #2   Title Patient will demonstrate independent use of home exercise program to facilitate ability to maintain/progress functional gains from skilled physical therapy services.    Time 8    Period Weeks    Status On-going    Target Date 06/12/20      PT LONG TERM GOAL #3   Title Pt. will demonstrate Lt [place] jt AROM WFL s symptoms to facilitate full range for daily activity s restriction due to symptoms.    Time 8    Period Weeks    Status On-going    Target Date 06/12/20      PT LONG TERM GOAL #4   Title Pt. will demonstrate Lt UE MMT 5/5 throughout to facilitate usual daily home and recreational activity at PLOF.    Time 8    Period Weeks    Status On-going    Target Date 06/12/20       PT LONG TERM GOAL #5   Title Pt. will reach FOTO outcome > or = 69% to indicated reduced disability 2' to condition.    Time 8    Period Weeks    Status On-going    Target Date 06/12/20                 Plan - 05/15/20 1321    Clinical Impression Statement Easy fatigue in elevation against gravity exercise intervention today at this time.    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   Stability/Clinical Decision Making Stable/Uncomplicated    Rehab Potential Good    PT Frequency --   1-2x/week   PT Duration 8 weeks    PT Treatment/Interventions ADLs/Self Care Home Management;Cryotherapy;Electrical Stimulation;Iontophoresis 4mg /ml Dexamethasone;Moist Heat;Balance training;Therapeutic exercise;Therapeutic activities;Functional mobility training;Stair training;Gait training;Ultrasound;Neuromuscular re-education;Patient/family education;Manual techniques;Dry needling;Passive range of motion;Spinal Manipulations;Joint Manipulations    PT Next Visit Plan AAROM gravity based strengthening, continued improved stability control in space.    PT Home Exercise Plan [id number]    Consulted and Agree with Plan of Care Patient           Patient will benefit from skilled therapeutic intervention in order to improve the following deficits and impairments:  Hypomobility,Pain,Impaired UE functional use,Decreased strength,Decreased activity tolerance,Decreased mobility,Difficulty walking,Impaired perceived functional ability,Improper body mechanics,Impaired flexibility,Decreased range of motion,Decreased coordination  Visit Diagnosis: Left shoulder pain, unspecified chronicity  Muscle weakness (generalized)  Stiffness of left shoulder, not elsewhere classified     Problem  List Patient Active Problem List   Diagnosis Date Noted  . Closed anterior dislocation of left shoulder 03/26/2020  . Macular hole of left eye 01/23/2020  . Chorioretinal scar 01/23/2020  . Macular hole of right eye 01/23/2020  . History of retinal detachment 01/23/2020  . Peripheral neuropathy 12/14/2017  . Lumbar radiculopathy, right 12/14/2017    Scot Jun, PT, DPT, OCS, ATC 05/15/20  1:35  PM    [facility] [address] [place], [place], [address] Phone: [phone]   Fax:  [phone]  Name: Sylvia Simpson MRN: [id number] Date of Birth: Jul 15, 1945

## 2020-05-20 ENCOUNTER — Encounter: Payer: Self-pay | Admitting: Rehabilitative and Restorative Service Providers"

## 2020-05-20 ENCOUNTER — Other Ambulatory Visit: Payer: Self-pay

## 2020-05-20 ENCOUNTER — Ambulatory Visit: Payer: PPO | Admitting: Rehabilitative and Restorative Service Providers"

## 2020-05-20 DIAGNOSIS — M25612 Stiffness of left shoulder, not elsewhere classified: Secondary | ICD-10-CM

## 2020-05-20 DIAGNOSIS — M25512 Pain in left shoulder: Secondary | ICD-10-CM | POA: Diagnosis not present

## 2020-05-20 DIAGNOSIS — M6281 Muscle weakness (generalized): Secondary | ICD-10-CM | POA: Diagnosis not present

## 2020-05-20 NOTE — Therapy (Signed)
[place], [place], [address] Phone: [phone]   Fax:  [phone]  Physical Therapy Treatment  Patient Details  Name: Sylvia Simpson MRN: [id number] Date of Birth: 08-04-1945 Referring Provider (PT): Dr. Erlinda Hong   Encounter Date: 05/20/2020   PT End of Session - 05/20/20 1304    Visit Number 7    Number of Visits 12    Date for PT Re-Evaluation 06/12/20    Progress Note Due on Visit 10    PT Start Time 1259    PT Stop Time 1339    PT Time Calculation (min) 40 min    Activity Tolerance Patient tolerated treatment well    Behavior During Therapy [facility] for tasks assessed/performed           Past Medical History:  Diagnosis Date  . Anxiety   . Asthma    as child, no issues now  . Cancer [facility]) 1997   right breast cancer-lumpectomy,chemo and radiation  . Hypertension   . Lumbar radiculopathy, right 12/14/2017   L5  . Peripheral neuropathy 12/14/2017    Past Surgical History:  Procedure Laterality Date  . ABDOMINAL HYSTERECTOMY    . BACK SURGERY    . BREAST EXCISIONAL BIOPSY Left 2019   benign  . BREAST LUMPECTOMY Right 1997  . CHOLECYSTECTOMY    . EYE SURGERY     retinal detachment  . RADIOACTIVE SEED GUIDED EXCISIONAL BREAST BIOPSY Left 06/01/2017   Procedure: LEFT RADIOACTIVE SEED GUIDED EXCISIONAL BREAST BIOPSY ERAS PATHWAY;  Surgeon: [name], MD;  Location: [place];  Service: General;  Laterality: Left;  . TUBAL LIGATION      There were no vitals filed for this visit.   Subjective Assessment - 05/20/20 1303    Subjective Pt. indicated feeling fairly well overall, not too sore with new progression.    Patient is accompained by: Family member    Limitations House hold activities    Patient Stated Goals Improve arm use, sleeping, get rid of sling.    Currently in Pain? No/denies    Pain Onset 1 to 4 weeks ago                             [facility] Adult PT  Treatment/Exercise - 05/20/20 0001      Shoulder Exercises: Supine   Other Supine Exercises supine d2 green band 2 x 10      Shoulder Exercises: Prone   Other Prone Exercises prone y 2 x 15, prone t 2 x 15      Shoulder Exercises: Standing   Flexion Both;20 reps   2 lb bar   Other Standing Exercises wall circles 2 lb cw, ccw 30 x 2 each way      Shoulder Exercises: ROM/Strengthening   UBE (Upper Arm Bike) Lvl 3.5 2.5 mins fwd/back each way                    PT Short Term Goals - 05/08/20 1333      PT SHORT TERM GOAL #1   Title Patient will demonstrate independent use of home exercise program to maintain progress from in clinic treatments.    Time 3    Period Weeks    Status Achieved    Target Date 05/08/20             PT Long Term Goals - 05/13/20 1304  PT LONG TERM GOAL #1   Title Patient will demonstrate/report pain at worst less than or equal to 2/10 to facilitate minimal limitation in daily activity secondary to pain symptoms.    Time 8    Period Weeks    Status On-going    Target Date 06/12/20      PT LONG TERM GOAL #2   Title Patient will demonstrate independent use of home exercise program to facilitate ability to maintain/progress functional gains from skilled physical therapy services.    Time 8    Period Weeks    Status On-going    Target Date 06/12/20      PT LONG TERM GOAL #3   Title Pt. will demonstrate Lt [place] jt AROM WFL s symptoms to facilitate full range for daily activity s restriction due to symptoms.    Time 8    Period Weeks    Status On-going    Target Date 06/12/20      PT LONG TERM GOAL #4   Title Pt. will demonstrate Lt UE MMT 5/5 throughout to facilitate usual daily home and recreational activity at PLOF.    Time 8    Period Weeks    Status On-going    Target Date 06/12/20      PT LONG TERM GOAL #5   Title Pt. will reach FOTO outcome > or = 69% to indicated reduced disability 2' to condition.    Time 8    Period  Weeks    Status On-going    Target Date 06/12/20                 Plan - 05/20/20 1315    Clinical Impression Statement Pt. to continue to benefit from progression in recruitment of lower trap, middle trap, external rotator for improve strength and endurance for Lt UE functional movement.    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   Stability/Clinical Decision Making Stable/Uncomplicated    Rehab Potential Good    PT Frequency --   1-2x/week   PT Duration 8 weeks    PT Treatment/Interventions ADLs/Self Care Home Management;Cryotherapy;Electrical Stimulation;Iontophoresis 4mg /ml Dexamethasone;Moist Heat;Balance training;Therapeutic exercise;Therapeutic activities;Functional mobility training;Stair training;Gait training;Ultrasound;Neuromuscular re-education;Patient/family education;Manual techniques;Dry needling;Passive range of motion;Spinal Manipulations;Joint Manipulations    PT Next Visit Plan gravity based strengthening with monitor on shrug, continued improved stability control in space.    PT Home Exercise Plan [id number]    Consulted and Agree with Plan of Care Patient           Patient will benefit from skilled therapeutic intervention in order to improve the following deficits and impairments:  Hypomobility,Pain,Impaired UE functional use,Decreased strength,Decreased activity tolerance,Decreased mobility,Difficulty walking,Impaired perceived functional ability,Improper body mechanics,Impaired flexibility,Decreased range of motion,Decreased coordination  Visit Diagnosis: Left shoulder pain, unspecified chronicity  Muscle weakness (generalized)  Stiffness of left shoulder, not elsewhere classified     Problem List Patient Active Problem List   Diagnosis  Date Noted  . Closed anterior dislocation of left shoulder 03/26/2020  . Macular hole of left eye 01/23/2020  . Chorioretinal scar 01/23/2020  . Macular hole of right eye 01/23/2020  . History of retinal detachment 01/23/2020  . Peripheral neuropathy 12/14/2017  . Lumbar radiculopathy, right 12/14/2017    Scot Jun, PT, DPT, OCS, ATC 05/20/20  1:35 PM    [facility] [address]  [place], [place], [address] Phone: [phone]   Fax:  [phone]  Name: Sylvia Simpson MRN: [id number] Date of Birth: 1945-11-10

## 2020-05-22 ENCOUNTER — Ambulatory Visit: Payer: PPO | Admitting: Rehabilitative and Restorative Service Providers"

## 2020-05-22 ENCOUNTER — Encounter: Payer: Self-pay | Admitting: Rehabilitative and Restorative Service Providers"

## 2020-05-22 ENCOUNTER — Other Ambulatory Visit: Payer: Self-pay

## 2020-05-22 DIAGNOSIS — M25612 Stiffness of left shoulder, not elsewhere classified: Secondary | ICD-10-CM | POA: Diagnosis not present

## 2020-05-22 DIAGNOSIS — M6281 Muscle weakness (generalized): Secondary | ICD-10-CM

## 2020-05-22 DIAGNOSIS — M25512 Pain in left shoulder: Secondary | ICD-10-CM | POA: Diagnosis not present

## 2020-05-22 NOTE — Therapy (Signed)
[place], [place], [address] Phone: [phone]   Fax:  [phone]  Physical Therapy Treatment/Progress Note  Patient Details  Name: Sylvia Simpson MRN: [id number] Date of Birth: 1945/07/15 Referring Provider (PT): Dr. Erlinda Hong   Encounter Date: 05/22/2020   Progress Note Reporting Period 04/17/2021 to 05/22/2020  See note below for Objective Data and Assessment of Progress/Goals.        PT End of Session - 05/22/20 1319    Visit Number 8    Number of Visits 12    Date for PT Re-Evaluation 06/12/20    Progress Note Due on Visit 10    PT Start Time 1259    PT Stop Time 1339    PT Time Calculation (min) 40 min    Activity Tolerance Patient tolerated treatment well    Behavior During Therapy WFL for tasks assessed/performed           Past Medical History:  Diagnosis Date  . Anxiety   . Asthma    as child, no issues now  . Cancer [facility]) 1997   right breast cancer-lumpectomy,chemo and radiation  . Hypertension   . Lumbar radiculopathy, right 12/14/2017   L5  . Peripheral neuropathy 12/14/2017    Past Surgical History:  Procedure Laterality Date  . ABDOMINAL HYSTERECTOMY    . BACK SURGERY    . BREAST EXCISIONAL BIOPSY Left 2019   benign  . BREAST LUMPECTOMY Right 1997  . CHOLECYSTECTOMY    . EYE SURGERY     retinal detachment  . RADIOACTIVE SEED GUIDED EXCISIONAL BREAST BIOPSY Left 06/01/2017   Procedure: LEFT RADIOACTIVE SEED GUIDED EXCISIONAL BREAST BIOPSY ERAS PATHWAY;  Surgeon: [name], MD;  Location: [place];  Service: General;  Laterality: Left;  . TUBAL LIGATION      There were no vitals filed for this visit.   Subjective Assessment - 05/22/20 1318    Subjective Pt. indicated no specific complaints of pain today.    Patient is accompained by: Family member    Limitations House hold activities    Patient Stated Goals Improve arm use, sleeping, get rid of sling.     Currently in Pain? No/denies    Pain Onset 1 to 4 weeks ago              [place] PT Assessment - 05/22/20 0001      Assessment   Medical Diagnosis Lt shoulder pain, anterior dislocation    Referring Provider (PT) Dr. Erlinda Hong    Onset Date/Surgical Date 03/23/20    Hand Dominance Right      Observation/Other Assessments   Focus on Therapeutic Outcomes (FOTO)  update: 59%      Posture/Postural Control   Posture Comments No sling      AROM   Overall AROM Comments Elevation against gravity in flexion Lt:  125 deg      Strength   Left Shoulder Flexion 4/5    Left Shoulder ABduction 4/5    Left Shoulder Internal Rotation 5/5    Left Shoulder External Rotation 4+/5                         [facility] Adult PT Treatment/Exercise - 05/22/20 0001      Shoulder Exercises: Standing   Extension Both   3 x 10 blue band   Theraband Level (Shoulder Extension) Level 4 (Blue)    Other Standing Exercises green band ER in 15  deg abd 3 x 10, IR 3 x 10    Other Standing Exercises standing wall circles cw, ccw 2 lb ball 90 deg flexion 30 x 2      Shoulder Exercises: ROM/Strengthening   UBE (Upper Arm Bike) Lvl 3.0 mins 4 min fwd/back each way                    PT Short Term Goals - 05/08/20 1333      PT SHORT TERM GOAL #1   Title Patient will demonstrate independent use of home exercise program to maintain progress from in clinic treatments.    Time 3    Period Weeks    Status Achieved    Target Date 05/08/20             PT Long Term Goals - 05/22/20 1327      PT LONG TERM GOAL #1   Title Patient will demonstrate/report pain at worst less than or equal to 2/10 to facilitate minimal limitation in daily activity secondary to pain symptoms.    Time 8    Period Weeks    Status Achieved      PT LONG TERM GOAL #2   Title Patient will demonstrate independent use of home exercise program to facilitate ability to maintain/progress functional gains from skilled  physical therapy services.    Time 8    Period Weeks    Status On-going    Target Date 06/12/20      PT LONG TERM GOAL #3   Title Pt. will demonstrate Lt [place] jt AROM WFL s symptoms to facilitate full range for daily activity s restriction due to symptoms.    Time 8    Period Weeks    Status Achieved    Target Date 06/12/20      PT LONG TERM GOAL #4   Title Pt. will demonstrate Lt UE MMT 5/5 throughout to facilitate usual daily home and recreational activity at PLOF.    Time 8    Period Weeks    Status On-going    Target Date 06/12/20      PT LONG TERM GOAL #5   Title Pt. will reach FOTO outcome > or = 69% to indicated reduced disability 2' to condition.    Time 8    Period Weeks    Status On-going                 Plan - 05/22/20 1324    Clinical Impression Statement Pt. has attended 8 visits overall during course of treatment.  Pt. indicated mild complaints of pain at worst with improvement of Lt UE rated about 85% at this time.  See objective data for updated information regarding current presentation.  Pt. has demonstrated gains but still has impairments of strength deficits primary that impair full return to PLOF at this time.  Continued skilled PT services recommended for continued gains at this time.    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   Stability/Clinical Decision Making Stable/Uncomplicated    Rehab Potential Good    PT Frequency --   1-2x/week   PT Duration 8 weeks    PT Treatment/Interventions ADLs/Self Care Home Management;Cryotherapy;Electrical Stimulation;Iontophoresis 4mg /ml Dexamethasone;Moist Heat;Balance training;Therapeutic exercise;Therapeutic activities;Functional mobility training;Stair training;Gait training;Ultrasound;Neuromuscular  re-education;Patient/family education;Manual techniques;Dry needling;Passive range of motion;Spinal Manipulations;Joint Manipulations  PT Next Visit Plan gravity based strengthening with monitor on shrug, continued improved stability control in space.    PT Home Exercise Plan [id number]    Consulted and Agree with Plan of Care Patient           Patient will benefit from skilled therapeutic intervention in order to improve the following deficits and impairments:  Hypomobility,Pain,Impaired UE functional use,Decreased strength,Decreased activity tolerance,Decreased mobility,Difficulty walking,Impaired perceived functional ability,Improper body mechanics,Impaired flexibility,Decreased range of motion,Decreased coordination  Visit Diagnosis: Left shoulder pain, unspecified chronicity  Muscle weakness (generalized)  Stiffness of left shoulder, not elsewhere classified     Problem List Patient Active Problem List   Diagnosis Date Noted  . Closed anterior dislocation of left shoulder 03/26/2020  . Macular hole of left eye 01/23/2020  . Chorioretinal scar 01/23/2020  . Macular hole of right eye 01/23/2020  . History of retinal detachment 01/23/2020  . Peripheral neuropathy 12/14/2017  . Lumbar radiculopathy, right 12/14/2017    Scot Jun, PT, DPT, OCS, ATC 05/22/20  1:39 PM    [facility] [address] [place], [place], [address] Phone: [phone]   Fax:  [phone]  Name: Sylvia Simpson MRN: [id number] Date of Birth: 11/18/1945

## 2020-05-24 DIAGNOSIS — N1831 Chronic kidney disease, stage 3a: Secondary | ICD-10-CM | POA: Diagnosis not present

## 2020-05-28 ENCOUNTER — Ambulatory Visit: Payer: PPO | Admitting: Orthopaedic Surgery

## 2020-05-28 ENCOUNTER — Other Ambulatory Visit: Payer: Self-pay

## 2020-05-28 DIAGNOSIS — S43015D Anterior dislocation of left humerus, subsequent encounter: Secondary | ICD-10-CM | POA: Diagnosis not present

## 2020-05-28 NOTE — Progress Notes (Signed)
Office Visit Note   Patient: Sylvia Simpson           Date of Birth: [birth year]           MRN: [id number] Visit Date: 05/28/2020              Requested by: Lajean Manes, MD [address] [place],  [place] [address] PCP: Lajean Manes, MD   Assessment & Plan: Visit Diagnoses:  1. Closed anterior dislocation of left shoulder, subsequent encounter     Plan: Impression is 9 weeks status post left anterior shoulder dislocation.  Patient is doing well.  She will wean out of formal physical therapy when ready but will continue to work on a home exercise program.  She will let pain be her guide with lifting.  Follow-up with [place] as needed.  Follow-Up Instructions: Return if symptoms worsen or fail to improve.   Orders:  No orders of the defined types were placed in this encounter.  No orders of the defined types were placed in this encounter.     Procedures: No procedures performed   Clinical Data: No additional findings.   Subjective: Chief Complaint  Patient presents with  . Other     Shoulder follow up    HPI patient is a pleasant 75 year old female who comes in today approximately 9 weeks out left anterior shoulder dislocation.  She has been doing well.  She has been in physical therapy making great progress.  She does note slight discomfort with terminal end of forward flexion.  Overall, she has improved quite a bit.     Objective: Vital Signs: There were no vitals taken for this visit.    Ortho Exam left shoulder exam reveals near full active range of motion.  Near full strength throughout.  She is neurovascular intact distally.  Specialty Comments:  No specialty comments available.  Imaging: No new imaging  PMFS History: Patient Active Problem List   Diagnosis Date Noted  . Closed anterior dislocation of left shoulder 03/26/2020  . Macular hole of left eye 01/23/2020  . Chorioretinal scar 01/23/2020  . Macular hole of right eye  01/23/2020  . History of retinal detachment 01/23/2020  . Peripheral neuropathy 12/14/2017  . Lumbar radiculopathy, right 12/14/2017   Past Medical History:  Diagnosis Date  . Anxiety   . Asthma    as child, no issues now  . Cancer [facility]) 1997   right breast cancer-lumpectomy,chemo and radiation  . Hypertension   . Lumbar radiculopathy, right 12/14/2017   L5  . Peripheral neuropathy 12/14/2017    Family History  Problem Relation Age of Onset  . Breast cancer Paternal Aunt 45    Past Surgical History:  Procedure Laterality Date  . ABDOMINAL HYSTERECTOMY    . BACK SURGERY    . BREAST EXCISIONAL BIOPSY Left 2019   benign  . BREAST LUMPECTOMY Right 1997  . CHOLECYSTECTOMY    . EYE SURGERY     retinal detachment  . RADIOACTIVE SEED GUIDED EXCISIONAL BREAST BIOPSY Left 06/01/2017   Procedure: LEFT RADIOACTIVE SEED GUIDED EXCISIONAL BREAST BIOPSY ERAS PATHWAY;  Surgeon: [name], MD;  Location: [place];  Service: General;  Laterality: Left;  . TUBAL LIGATION     Social History   Occupational History  . Not on file  Tobacco Use  . Smoking status: Former Research scientist (life sciences)  . Smokeless tobacco: Never Used  Substance and Sexual Activity  . Alcohol use: Yes  Comment: social  . Drug use: No  . Sexual activity: Not on file

## 2020-05-29 ENCOUNTER — Encounter: Payer: Self-pay | Admitting: Rehabilitative and Restorative Service Providers"

## 2020-05-29 ENCOUNTER — Ambulatory Visit: Payer: PPO | Admitting: Rehabilitative and Restorative Service Providers"

## 2020-05-29 DIAGNOSIS — M25612 Stiffness of left shoulder, not elsewhere classified: Secondary | ICD-10-CM | POA: Diagnosis not present

## 2020-05-29 DIAGNOSIS — M25512 Pain in left shoulder: Secondary | ICD-10-CM | POA: Diagnosis not present

## 2020-05-29 DIAGNOSIS — M6281 Muscle weakness (generalized): Secondary | ICD-10-CM | POA: Diagnosis not present

## 2020-05-29 NOTE — Therapy (Signed)
[facility] [place], [place], [address] Phone: [phone]   Fax:  [phone]  Physical Therapy Treatment  Patient Details  Name: Sylvia Simpson MRN: [id number] Date of Birth: [birth year] Referring Provider (PT): Dr. Erlinda Hong   Encounter Date: 05/29/2020   PT End of Session - 05/29/20 1405    Visit Number 9    Number of Visits 12    Date for PT Re-Evaluation 06/12/20    Progress Note Due on Visit 12    PT Start Time 1340    PT Stop Time 1420    PT Time Calculation (min) 40 min    Activity Tolerance Patient tolerated treatment well    Behavior During Therapy [facility] for tasks assessed/performed           Past Medical History:  Diagnosis Date  . Anxiety   . Asthma    as child, no issues now  . Cancer [facility]) 1997   right breast cancer-lumpectomy,chemo and radiation  . Hypertension   . Lumbar radiculopathy, right 12/14/2017   L5  . Peripheral neuropathy 12/14/2017    Past Surgical History:  Procedure Laterality Date  . ABDOMINAL HYSTERECTOMY    . BACK SURGERY    . BREAST EXCISIONAL BIOPSY Left 2019   benign  . BREAST LUMPECTOMY Right 1997  . CHOLECYSTECTOMY    . EYE SURGERY     retinal detachment  . RADIOACTIVE SEED GUIDED EXCISIONAL BREAST BIOPSY Left 06/01/2017   Procedure: LEFT RADIOACTIVE SEED GUIDED EXCISIONAL BREAST BIOPSY ERAS PATHWAY;  Surgeon: [name], MD;  Location: [place];  Service: General;  Laterality: Left;  . TUBAL LIGATION      There were no vitals filed for this visit.   Subjective Assessment - 05/29/20 1403    Subjective Pt. stated went to gym today, getting her workouts in.  No pain usually, just uncomfortable at times after exercise.    Patient is accompained by: Family member    Limitations House hold activities    Patient Stated Goals Improve arm use, sleeping, get rid of sling.    Currently in Pain? No/denies    Pain Score 0-No pain    Pain Location Shoulder    Pain  Orientation Left    Pain Descriptors / Indicators Sore    Pain Onset 1 to 4 weeks ago    Pain Frequency Occasional    Aggravating Factors  sore after exercises at times                             [facility] Adult PT Treatment/Exercise - 05/29/20 0001      Shoulder Exercises: Standing   Other Standing Exercises blue band er/ir 3 x 10 each c cues for prevent compensatory movement    Other Standing Exercises wall y slides c lift off lower trap focus x 15, standing flexion to abd and lower and reverse x 10      Shoulder Exercises: ROM/Strengthening   UBE (Upper Arm Bike) Lvl 3.5 4 mins fwd/back with 10 second intervals                  PT Education - 05/29/20 1418    Education Details HEP reprint with newer exercise    Person(s) Educated Patient    Methods Explanation;Demonstration;Verbal cues;Handout    Comprehension Returned demonstration;Verbalized understanding            PT Short Term Goals - 05/08/20 1333  PT SHORT TERM GOAL #1   Title Patient will demonstrate independent use of home exercise program to maintain progress from in clinic treatments.    Time 3    Period Weeks    Status Achieved    Target Date 05/08/20             PT Long Term Goals - 05/22/20 1327      PT LONG TERM GOAL #1   Title Patient will demonstrate/report pain at worst less than or equal to 2/10 to facilitate minimal limitation in daily activity secondary to pain symptoms.    Time 8    Period Weeks    Status Achieved      PT LONG TERM GOAL #2   Title Patient will demonstrate independent use of home exercise program to facilitate ability to maintain/progress functional gains from skilled physical therapy services.    Time 8    Period Weeks    Status On-going    Target Date 06/12/20      PT LONG TERM GOAL #3   Title Pt. will demonstrate Lt [place] jt AROM WFL s symptoms to facilitate full range for daily activity s restriction due to symptoms.    Time 8    Period  Weeks    Status Achieved    Target Date 06/12/20      PT LONG TERM GOAL #4   Title Pt. will demonstrate Lt UE MMT 5/5 throughout to facilitate usual daily home and recreational activity at PLOF.    Time 8    Period Weeks    Status On-going    Target Date 06/12/20      PT LONG TERM GOAL #5   Title Pt. will reach FOTO outcome > or = 69% to indicated reduced disability 2' to condition.    Time 8    Period Weeks    Status On-going                 Plan - 05/29/20 1405    Clinical Impression Statement Pt. able to demonstrate forward elevation to 85% or so or Rt c very mild compensatory shrug noted.  Abduction elevation against gravity limited more, around 90 deg.  Continued strengthening transitioning to upright and gravity resisted movement warrented at this time.    Personal Factors and Comorbidities Other    Examination-Activity Limitations Carry;Caring for Others;Lift;Reach Overhead;Dressing;Hygiene/Grooming    Examination-Participation Restrictions Tour manager    Stability/Clinical Decision Making Stable/Uncomplicated    Clinical Decision Making Low    Rehab Potential Good    PT Frequency Other (comment)    PT Duration 8 weeks    PT Treatment/Interventions ADLs/Self Care Home Management;Cryotherapy;Electrical Stimulation;Iontophoresis 4mg /ml Dexamethasone;Moist Heat;Balance training;Therapeutic exercise;Therapeutic activities;Functional mobility training;Stair training;Gait training;Ultrasound;Neuromuscular re-education;Patient/family education;Manual techniques;Dry needling;Passive range of motion;Spinal Manipulations;Joint Manipulations    PT Next Visit Plan gravity based strengthening with monitor on shrug, continued improved stability control in space.    PT Home Exercise Plan [id number]    Consulted and Agree with Plan of Care Patient           Patient will benefit from skilled therapeutic intervention in order to improve the following deficits and impairments:   Hypomobility,Pain,Impaired UE functional use,Decreased strength,Decreased activity tolerance,Decreased mobility,Difficulty walking,Impaired perceived functional ability,Improper body mechanics,Impaired flexibility,Decreased range of motion,Decreased coordination  Visit Diagnosis: Left shoulder pain, unspecified chronicity  Muscle weakness (generalized)  Stiffness of left shoulder, not elsewhere classified     Problem List Patient Active Problem List   Diagnosis Date Noted  .  Closed anterior dislocation of left shoulder 03/26/2020  . Macular hole of left eye 01/23/2020  . Chorioretinal scar 01/23/2020  . Macular hole of right eye 01/23/2020  . History of retinal detachment 01/23/2020  . Peripheral neuropathy 12/14/2017  . Lumbar radiculopathy, right 12/14/2017    Scot Jun, PT, DPT, OCS, ATC 05/29/20  2:20 PM    [facility] [address] [place], [place], [address] Phone: [phone]   Fax:  [phone]  Name: CARLYLE ACHENBACH MRN: [id number] Date of Birth: [birth year]

## 2020-05-29 NOTE — Patient Instructions (Signed)
Access Code: [email or web address] URL: [email or web address] Date: 05/29/2020 Prepared by: Scot Jun  Exercises Standing Shoulder Row with Anchored Resistance - 2 x daily - 7 x weekly - 3 sets - 10 reps Shoulder Extension with Resistance - 2 x daily - 7 x weekly - 3 sets - 10 reps Supine Shoulder Horizontal Abduction with Resistance - 2 x daily - 7 x weekly - 10 reps - 3 sets Sidelying Shoulder Flexion 15 Degrees - 12 x daily - 7 x weekly - 3 sets - 10-15 reps Sidelying Shoulder Abduction Palm Forward - 1 x daily - 7 x weekly - 3 sets - 10-15 reps Sidelying Shoulder ER with Towel and Dumbbell - 1 x daily - 7 x weekly - 3 sets - 10-15 reps Shoulder External Rotation with Anchored Resistance - 1 x daily - 7 x weekly - 3 sets - 10 reps Standing Shoulder Internal Rotation with Anchored Resistance - 1 x daily - 7 x weekly - 3 sets - 10 reps Standing Shoulder Flexion to 90 Degrees with Dumbbells - 1 x daily - 7 x weekly - 3 sets - 10 reps

## 2020-05-30 DIAGNOSIS — N1831 Chronic kidney disease, stage 3a: Secondary | ICD-10-CM | POA: Diagnosis not present

## 2020-05-30 DIAGNOSIS — I129 Hypertensive chronic kidney disease with stage 1 through stage 4 chronic kidney disease, or unspecified chronic kidney disease: Secondary | ICD-10-CM | POA: Diagnosis not present

## 2020-05-30 DIAGNOSIS — E213 Hyperparathyroidism, unspecified: Secondary | ICD-10-CM | POA: Diagnosis not present

## 2020-06-05 ENCOUNTER — Encounter: Payer: Self-pay | Admitting: Rehabilitative and Restorative Service Providers"

## 2020-06-05 ENCOUNTER — Ambulatory Visit: Payer: PPO | Admitting: Rehabilitative and Restorative Service Providers"

## 2020-06-05 ENCOUNTER — Other Ambulatory Visit: Payer: Self-pay

## 2020-06-05 DIAGNOSIS — M25512 Pain in left shoulder: Secondary | ICD-10-CM

## 2020-06-05 DIAGNOSIS — M6281 Muscle weakness (generalized): Secondary | ICD-10-CM | POA: Diagnosis not present

## 2020-06-05 DIAGNOSIS — M25612 Stiffness of left shoulder, not elsewhere classified: Secondary | ICD-10-CM | POA: Diagnosis not present

## 2020-06-05 NOTE — Therapy (Signed)
[place], [place], [address] Phone: [phone]   Fax:  [phone]  Physical Therapy Treatment  Patient Details  Name: Sylvia Simpson MRN: [id number] Date of Birth: Sep 17, 1945 Referring Provider (PT): Dr. Erlinda Hong   Encounter Date: 06/05/2020   PT End of Session - 06/05/20 1331    Visit Number 10    Number of Visits 12    Date for PT Re-Evaluation 06/12/20    Progress Note Due on Visit 12    PT Start Time 1340    PT Stop Time 1420    PT Time Calculation (min) 40 min    Activity Tolerance Patient tolerated treatment well    Behavior During Therapy [facility] for tasks assessed/performed           Past Medical History:  Diagnosis Date  . Anxiety   . Asthma    as child, no issues now  . Cancer [facility]) 1997   right breast cancer-lumpectomy,chemo and radiation  . Hypertension   . Lumbar radiculopathy, right 12/14/2017   L5  . Peripheral neuropathy 12/14/2017    Past Surgical History:  Procedure Laterality Date  . ABDOMINAL HYSTERECTOMY    . BACK SURGERY    . BREAST EXCISIONAL BIOPSY Left 2019   benign  . BREAST LUMPECTOMY Right 1997  . CHOLECYSTECTOMY    . EYE SURGERY     retinal detachment  . RADIOACTIVE SEED GUIDED EXCISIONAL BREAST BIOPSY Left 06/01/2017   Procedure: LEFT RADIOACTIVE SEED GUIDED EXCISIONAL BREAST BIOPSY ERAS PATHWAY;  Surgeon: [name], MD;  Location: [place];  Service: General;  Laterality: Left;  . TUBAL LIGATION      There were no vitals filed for this visit.   Subjective Assessment - 06/05/20 1342    Subjective Pt. indicated no pain to report.   Pt. indicated not "having trouble with stuff" but has popping at times.    Patient is accompained by: Family member    Limitations House hold activities    Patient Stated Goals Improve arm use, sleeping, get rid of sling.    Currently in Pain? No/denies    Pain Onset 1 to 4 weeks ago                              [facility] Adult PT Treatment/Exercise - 06/05/20 0001      Neuro Re-ed    Neuro Re-ed Details  rhythmic stabilizations in 100 deg flexion, scaption c moderate resistance 20 sec bouts      Shoulder Exercises: Sidelying   ABduction Left   3 lb x 10, 2 x 15 2 lb     Shoulder Exercises: Standing   Flexion Both   3 x 10 1 lb   Shoulder Flexion Weight (lbs) 1    Other Standing Exercises 90 deg flexion circles on wall 2 lb cw, ccw 30x2 bilateral      Shoulder Exercises: ROM/Strengthening   UBE (Upper Arm Bike) Lvl 3.5 3 mins fwd/rev each way                    PT Short Term Goals - 05/08/20 1333      PT SHORT TERM GOAL #1   Title Patient will demonstrate independent use of home exercise program to maintain progress from in clinic treatments.    Time 3    Period Weeks    Status Achieved    Target Date  05/08/20             PT Long Term Goals - 05/22/20 1327      PT LONG TERM GOAL #1   Title Patient will demonstrate/report pain at worst less than or equal to 2/10 to facilitate minimal limitation in daily activity secondary to pain symptoms.    Time 8    Period Weeks    Status Achieved      PT LONG TERM GOAL #2   Title Patient will demonstrate independent use of home exercise program to facilitate ability to maintain/progress functional gains from skilled physical therapy services.    Time 8    Period Weeks    Status On-going    Target Date 06/12/20      PT LONG TERM GOAL #3   Title Pt. will demonstrate Lt [place] jt AROM WFL s symptoms to facilitate full range for daily activity s restriction due to symptoms.    Time 8    Period Weeks    Status Achieved    Target Date 06/12/20      PT LONG TERM GOAL #4   Title Pt. will demonstrate Lt UE MMT 5/5 throughout to facilitate usual daily home and recreational activity at PLOF.    Time 8    Period Weeks    Status On-going    Target Date 06/12/20      PT LONG TERM GOAL #5   Title Pt.  will reach FOTO outcome > or = 69% to indicated reduced disability 2' to condition.    Time 8    Period Weeks    Status On-going                 Plan - 06/05/20 1356    Clinical Impression Statement Focused today on standing functional reaching strengthening for daily use activity such as reaching to cabinets, etc.  Fatigue noted but showing progress overall.    Personal Factors and Comorbidities Other    Examination-Activity Limitations Carry;Caring for Others;Lift;Reach Overhead;Dressing;Hygiene/Grooming    Examination-Participation Restrictions Tour manager    Stability/Clinical Decision Making Stable/Uncomplicated    Rehab Potential Good    PT Frequency Other (comment)    PT Duration 8 weeks    PT Treatment/Interventions ADLs/Self Care Home Management;Cryotherapy;Electrical Stimulation;Iontophoresis 4mg /ml Dexamethasone;Moist Heat;Balance training;Therapeutic exercise;Therapeutic activities;Functional mobility training;Stair training;Gait training;Ultrasound;Neuromuscular re-education;Patient/family education;Manual techniques;Dry needling;Passive range of motion;Spinal Manipulations;Joint Manipulations    PT Next Visit Plan gravity based strengthening with monitor on shrug, continued improved stability control in space.    PT Home Exercise Plan [email or web address]    Consulted and Agree with Plan of Care Patient           Patient will benefit from skilled therapeutic intervention in order to improve the following deficits and impairments:  Hypomobility,Pain,Impaired UE functional use,Decreased strength,Decreased activity tolerance,Decreased mobility,Difficulty walking,Impaired perceived functional ability,Improper body mechanics,Impaired flexibility,Decreased range of motion,Decreased coordination  Visit Diagnosis: Left shoulder pain, unspecified chronicity  Muscle weakness (generalized)  Stiffness of left shoulder, not elsewhere classified     Problem List Patient Active  Problem List   Diagnosis Date Noted  . Closed anterior dislocation of left shoulder 03/26/2020  . Macular hole of left eye 01/23/2020  . Chorioretinal scar 01/23/2020  . Macular hole of right eye 01/23/2020  . History of retinal detachment 01/23/2020  . Peripheral neuropathy 12/14/2017  . Lumbar radiculopathy, right 12/14/2017    Scot Jun, PT, DPT, OCS, ATC 06/05/20  2:10 PM    [facility] [address]  [place], [place], [address] Phone: [phone]   Fax:  [phone]  Name: Sylvia Simpson MRN: [id number] Date of Birth: 1945-11-10

## 2020-06-07 DIAGNOSIS — J8410 Pulmonary fibrosis, unspecified: Secondary | ICD-10-CM | POA: Diagnosis not present

## 2020-06-07 DIAGNOSIS — E041 Nontoxic single thyroid nodule: Secondary | ICD-10-CM | POA: Diagnosis not present

## 2020-06-07 DIAGNOSIS — D351 Benign neoplasm of parathyroid gland: Secondary | ICD-10-CM | POA: Diagnosis not present

## 2020-06-07 DIAGNOSIS — R911 Solitary pulmonary nodule: Secondary | ICD-10-CM | POA: Diagnosis not present

## 2020-06-07 DIAGNOSIS — J439 Emphysema, unspecified: Secondary | ICD-10-CM | POA: Diagnosis not present

## 2020-06-07 DIAGNOSIS — I700 Atherosclerosis of aorta: Secondary | ICD-10-CM | POA: Diagnosis not present

## 2020-06-12 ENCOUNTER — Encounter: Payer: Self-pay | Admitting: Rehabilitative and Restorative Service Providers"

## 2020-06-12 ENCOUNTER — Ambulatory Visit: Payer: PPO | Admitting: Rehabilitative and Restorative Service Providers"

## 2020-06-12 ENCOUNTER — Other Ambulatory Visit: Payer: Self-pay

## 2020-06-12 DIAGNOSIS — M6281 Muscle weakness (generalized): Secondary | ICD-10-CM | POA: Diagnosis not present

## 2020-06-12 DIAGNOSIS — M25612 Stiffness of left shoulder, not elsewhere classified: Secondary | ICD-10-CM | POA: Diagnosis not present

## 2020-06-12 DIAGNOSIS — M25512 Pain in left shoulder: Secondary | ICD-10-CM

## 2020-06-12 NOTE — Therapy (Addendum)
[facility], [place], [address] Phone: [phone]   Fax:  [phone]  Physical Therapy Treatment/Progress Note/Discharge  Patient Details  Name: Sylvia Simpson MRN: [id number] Date of Birth: [birth year] Referring Provider (PT): Dr. Erlinda Hong   Encounter Date: 06/12/2020   Progress Note Reporting Period 05/22/2020 to 06/12/2020  See note below for Objective Data and Assessment of Progress/Goals.        PT End of Session - 06/12/20 1352    Visit Number 11    Number of Visits 12    Date for PT Re-Evaluation 06/12/20    Progress Note Due on Visit 12    PT Start Time 1342    PT Stop Time 1422    PT Time Calculation (min) 40 min    Activity Tolerance Patient tolerated treatment well    Behavior During Therapy WFL for tasks assessed/performed           Past Medical History:  Diagnosis Date  . Anxiety   . Asthma    as child, no issues now  . Cancer [facility]) 1997   right breast cancer-lumpectomy,chemo and radiation  . Hypertension   . Lumbar radiculopathy, right 12/14/2017   L5  . Peripheral neuropathy 12/14/2017    Past Surgical History:  Procedure Laterality Date  . ABDOMINAL HYSTERECTOMY    . BACK SURGERY    . BREAST EXCISIONAL BIOPSY Left 2019   benign  . BREAST LUMPECTOMY Right 1997  . CHOLECYSTECTOMY    . EYE SURGERY     retinal detachment  . RADIOACTIVE SEED GUIDED EXCISIONAL BREAST BIOPSY Left 06/01/2017   Procedure: LEFT RADIOACTIVE SEED GUIDED EXCISIONAL BREAST BIOPSY ERAS PATHWAY;  Surgeon: [name], MD;  Location: [place];  Service: General;  Laterality: Left;  . TUBAL LIGATION      There were no vitals filed for this visit.   Subjective Assessment - 06/12/20 1351    Subjective Pt. stated no pain and getting better overall.    Patient is accompained by: Family member    Limitations House hold activities    Patient Stated Goals Improve arm use, sleeping, get rid of sling.     Currently in Pain? No/denies    Pain Onset 1 to 4 weeks ago              [facility] PT Assessment - 06/12/20 0001      Assessment   Medical Diagnosis Lt shoulder pain, anterior dislocation    Referring Provider (PT) Dr. Erlinda Hong    Onset Date/Surgical Date 03/23/20    Hand Dominance Right      Observation/Other Assessments   Focus on Therapeutic Outcomes (FOTO)  update 68%      AROM   Left Shoulder Flexion 156 Degrees    Left Shoulder ABduction 140 Degrees    Left Shoulder Internal Rotation 82 Degrees   45 deg abd supine   Left Shoulder External Rotation 75 Degrees   45 deg abd supine     Strength   Left Shoulder Flexion 4/5    Left Shoulder ABduction 4/5    Left Shoulder External Rotation 5/5                         [facility] Adult PT Treatment/Exercise - 06/12/20 0001      Shoulder Exercises: Standing   Flexion Both   3 x 15 to shoulder height   Shoulder Flexion Weight (lbs) 1    ABduction  Both   3 x 15   Other Standing Exercises Scaption Y wall slide c lift off x 10, wall push up 2 x 10      Shoulder Exercises: ROM/Strengthening   UBE (Upper Arm Bike) Lvl 3.5 4 mins fwd/back c 10 sec harder intervals at end of each minute.                    PT Short Term Goals - 05/08/20 1333      PT SHORT TERM GOAL #1   Title Patient will demonstrate independent use of home exercise program to maintain progress from in clinic treatments.    Time 3    Period Weeks    Status Achieved    Target Date 05/08/20             PT Long Term Goals - 06/12/20 1404      PT LONG TERM GOAL #1   Title Patient will demonstrate/report pain at worst less than or equal to 2/10 to facilitate minimal limitation in daily activity secondary to pain symptoms.    Time 8    Period Weeks    Status Achieved      PT LONG TERM GOAL #2   Title Patient will demonstrate independent use of home exercise program to facilitate ability to maintain/progress functional gains from skilled  physical therapy services.    Time 8    Period Weeks    Status Achieved      PT LONG TERM GOAL #3   Title Pt. will demonstrate Lt [place] jt AROM WFL s symptoms to facilitate full range for daily activity s restriction due to symptoms.    Time 8    Period Weeks    Status Achieved      PT LONG TERM GOAL #4   Title Pt. will demonstrate Lt UE MMT 5/5 throughout to facilitate usual daily home and recreational activity at PLOF.    Baseline 06/12/2020: 4/5 flexion, abduction    Time 8    Period Weeks    Status Partially Met      PT LONG TERM GOAL #5   Title Pt. will reach FOTO outcome > or = 69% to indicated reduced disability 2' to condition.    Time 8    Period Weeks    Status Achieved                 Plan - 06/12/20 1404    Clinical Impression Statement Pt. has attended 11 visits overall at this time, reporting reduced symptoms and improved function.  See objective data for updated information. Pt. has made gains in most areas.  Strength in elevation still lacking compared to norms but improving.  Pt. is knowledgeable of HEP at this time.  Pt. requested hold of PT with possible D/C to HEP due to unrelated medical condition c possible surgery upcoming.    Personal Factors and Comorbidities Other    Examination-Activity Limitations Carry;Caring for Others;Lift;Reach Overhead;Dressing;Hygiene/Grooming    Examination-Participation Restrictions Tour manager    Stability/Clinical Decision Making Stable/Uncomplicated    Rehab Potential Good    PT Frequency Other (comment)    PT Duration 8 weeks    PT Treatment/Interventions ADLs/Self Care Home Management;Cryotherapy;Electrical Stimulation;Iontophoresis 10m/ml Dexamethasone;Moist Heat;Balance training;Therapeutic exercise;Therapeutic activities;Functional mobility training;Stair training;Gait training;Ultrasound;Neuromuscular re-education;Patient/family education;Manual techniques;Dry needling;Passive range of motion;Spinal  Manipulations;Joint Manipulations    PT Next Visit Plan HEP trial, possible D/C based off other surgery possiblity.    [facility]  Consulted and Agree with Plan of Care Patient           Patient will benefit from skilled therapeutic intervention in order to improve the following deficits and impairments:  Hypomobility,Pain,Impaired UE functional use,Decreased strength,Decreased activity tolerance,Decreased mobility,Difficulty walking,Impaired perceived functional ability,Improper body mechanics,Impaired flexibility,Decreased range of motion,Decreased coordination  Visit Diagnosis: Left shoulder pain, unspecified chronicity  Muscle weakness (generalized)  Stiffness of left shoulder, not elsewhere classified     Problem List Patient Active Problem List   Diagnosis Date Noted  . Closed anterior dislocation of left shoulder 03/26/2020  . Macular hole of left eye 01/23/2020  . Chorioretinal scar 01/23/2020  . Macular hole of right eye 01/23/2020  . History of retinal detachment 01/23/2020  . Peripheral neuropathy 12/14/2017  . Lumbar radiculopathy, right 12/14/2017   Scot Jun, PT, DPT, OCS, ATC 06/12/20  2:18 PM   PHYSICAL THERAPY DISCHARGE SUMMARY  Visits from Start of Care: 11  Current functional level related to goals / functional outcomes: See note   Remaining deficits: See note   Education / Equipment: HEP Plan: Patient agrees to discharge.  Patient goals were met. Patient is being discharged due to being pleased with the current functional level.  ?????    Scot Jun, PT, DPT, OCS, ATC 07/11/20  11:26 AM        [facility] [address] [place], [place], [address] Phone: [phone]   Fax:  [phone]  Name: CHELBIE JARNAGIN MRN: [id number] Date of Birth: Feb 08, 1946

## 2020-06-13 DIAGNOSIS — N183 Chronic kidney disease, stage 3 unspecified: Secondary | ICD-10-CM | POA: Diagnosis not present

## 2020-06-13 DIAGNOSIS — E785 Hyperlipidemia, unspecified: Secondary | ICD-10-CM | POA: Diagnosis not present

## 2020-06-13 DIAGNOSIS — F419 Anxiety disorder, unspecified: Secondary | ICD-10-CM | POA: Diagnosis not present

## 2020-06-13 DIAGNOSIS — E213 Hyperparathyroidism, unspecified: Secondary | ICD-10-CM | POA: Diagnosis not present

## 2020-06-13 DIAGNOSIS — I129 Hypertensive chronic kidney disease with stage 1 through stage 4 chronic kidney disease, or unspecified chronic kidney disease: Secondary | ICD-10-CM | POA: Diagnosis not present

## 2020-06-14 DIAGNOSIS — Z01818 Encounter for other preprocedural examination: Secondary | ICD-10-CM | POA: Diagnosis not present

## 2020-06-14 DIAGNOSIS — E213 Hyperparathyroidism, unspecified: Secondary | ICD-10-CM | POA: Diagnosis not present

## 2020-06-14 DIAGNOSIS — I440 Atrioventricular block, first degree: Secondary | ICD-10-CM | POA: Diagnosis not present

## 2020-06-14 DIAGNOSIS — E041 Nontoxic single thyroid nodule: Secondary | ICD-10-CM | POA: Diagnosis not present

## 2020-06-17 DIAGNOSIS — Z853 Personal history of malignant neoplasm of breast: Secondary | ICD-10-CM | POA: Diagnosis not present

## 2020-06-17 DIAGNOSIS — I10 Essential (primary) hypertension: Secondary | ICD-10-CM | POA: Diagnosis not present

## 2020-06-17 DIAGNOSIS — E785 Hyperlipidemia, unspecified: Secondary | ICD-10-CM | POA: Diagnosis not present

## 2020-06-17 DIAGNOSIS — N183 Chronic kidney disease, stage 3 unspecified: Secondary | ICD-10-CM | POA: Diagnosis not present

## 2020-06-26 DIAGNOSIS — E785 Hyperlipidemia, unspecified: Secondary | ICD-10-CM | POA: Diagnosis not present

## 2020-06-26 DIAGNOSIS — I10 Essential (primary) hypertension: Secondary | ICD-10-CM | POA: Diagnosis not present

## 2020-06-26 DIAGNOSIS — N183 Chronic kidney disease, stage 3 unspecified: Secondary | ICD-10-CM | POA: Diagnosis not present

## 2020-06-26 DIAGNOSIS — Z853 Personal history of malignant neoplasm of breast: Secondary | ICD-10-CM | POA: Diagnosis not present

## 2020-06-27 DIAGNOSIS — I10 Essential (primary) hypertension: Secondary | ICD-10-CM | POA: Diagnosis not present

## 2020-06-27 DIAGNOSIS — R9431 Abnormal electrocardiogram [ECG] [EKG]: Secondary | ICD-10-CM | POA: Diagnosis not present

## 2020-06-27 DIAGNOSIS — R012 Other cardiac sounds: Secondary | ICD-10-CM | POA: Diagnosis not present

## 2020-06-27 DIAGNOSIS — Z01818 Encounter for other preprocedural examination: Secondary | ICD-10-CM | POA: Diagnosis not present

## 2020-06-28 DIAGNOSIS — I440 Atrioventricular block, first degree: Secondary | ICD-10-CM | POA: Diagnosis not present

## 2020-07-15 DIAGNOSIS — E210 Primary hyperparathyroidism: Secondary | ICD-10-CM | POA: Diagnosis not present

## 2020-07-15 DIAGNOSIS — E211 Secondary hyperparathyroidism, not elsewhere classified: Secondary | ICD-10-CM | POA: Diagnosis not present

## 2020-07-15 DIAGNOSIS — E785 Hyperlipidemia, unspecified: Secondary | ICD-10-CM | POA: Diagnosis not present

## 2020-07-15 DIAGNOSIS — N183 Chronic kidney disease, stage 3 unspecified: Secondary | ICD-10-CM | POA: Diagnosis not present

## 2020-07-15 DIAGNOSIS — I129 Hypertensive chronic kidney disease with stage 1 through stage 4 chronic kidney disease, or unspecified chronic kidney disease: Secondary | ICD-10-CM | POA: Diagnosis not present

## 2020-07-15 DIAGNOSIS — D351 Benign neoplasm of parathyroid gland: Secondary | ICD-10-CM | POA: Diagnosis not present

## 2020-07-15 DIAGNOSIS — F419 Anxiety disorder, unspecified: Secondary | ICD-10-CM | POA: Diagnosis not present

## 2020-07-15 DIAGNOSIS — Z79899 Other long term (current) drug therapy: Secondary | ICD-10-CM | POA: Diagnosis not present

## 2020-08-02 DIAGNOSIS — N1831 Chronic kidney disease, stage 3a: Secondary | ICD-10-CM | POA: Diagnosis not present

## 2020-08-06 DIAGNOSIS — N183 Chronic kidney disease, stage 3 unspecified: Secondary | ICD-10-CM | POA: Diagnosis not present

## 2020-08-06 DIAGNOSIS — I129 Hypertensive chronic kidney disease with stage 1 through stage 4 chronic kidney disease, or unspecified chronic kidney disease: Secondary | ICD-10-CM | POA: Diagnosis not present

## 2020-08-06 DIAGNOSIS — N1831 Chronic kidney disease, stage 3a: Secondary | ICD-10-CM | POA: Diagnosis not present

## 2020-08-20 DIAGNOSIS — N183 Chronic kidney disease, stage 3 unspecified: Secondary | ICD-10-CM | POA: Diagnosis not present

## 2020-08-20 DIAGNOSIS — I10 Essential (primary) hypertension: Secondary | ICD-10-CM | POA: Diagnosis not present

## 2020-08-20 DIAGNOSIS — E785 Hyperlipidemia, unspecified: Secondary | ICD-10-CM | POA: Diagnosis not present

## 2020-08-22 DIAGNOSIS — I340 Nonrheumatic mitral (valve) insufficiency: Secondary | ICD-10-CM | POA: Diagnosis not present

## 2020-08-22 DIAGNOSIS — I517 Cardiomegaly: Secondary | ICD-10-CM | POA: Diagnosis not present

## 2020-10-14 ENCOUNTER — Other Ambulatory Visit: Payer: Self-pay | Admitting: Internal Medicine

## 2020-10-14 DIAGNOSIS — Z1231 Encounter for screening mammogram for malignant neoplasm of breast: Secondary | ICD-10-CM

## 2020-10-22 DIAGNOSIS — N183 Chronic kidney disease, stage 3 unspecified: Secondary | ICD-10-CM | POA: Diagnosis not present

## 2020-10-22 DIAGNOSIS — I10 Essential (primary) hypertension: Secondary | ICD-10-CM | POA: Diagnosis not present

## 2020-10-22 DIAGNOSIS — E785 Hyperlipidemia, unspecified: Secondary | ICD-10-CM | POA: Diagnosis not present

## 2020-10-22 DIAGNOSIS — Z853 Personal history of malignant neoplasm of breast: Secondary | ICD-10-CM | POA: Diagnosis not present

## 2020-10-31 DIAGNOSIS — G629 Polyneuropathy, unspecified: Secondary | ICD-10-CM | POA: Diagnosis not present

## 2020-10-31 DIAGNOSIS — I10 Essential (primary) hypertension: Secondary | ICD-10-CM | POA: Diagnosis not present

## 2020-10-31 DIAGNOSIS — D638 Anemia in other chronic diseases classified elsewhere: Secondary | ICD-10-CM | POA: Diagnosis not present

## 2020-10-31 DIAGNOSIS — R7309 Other abnormal glucose: Secondary | ICD-10-CM | POA: Diagnosis not present

## 2020-10-31 DIAGNOSIS — R7303 Prediabetes: Secondary | ICD-10-CM | POA: Diagnosis not present

## 2020-10-31 DIAGNOSIS — M5441 Lumbago with sciatica, right side: Secondary | ICD-10-CM | POA: Diagnosis not present

## 2020-10-31 DIAGNOSIS — E785 Hyperlipidemia, unspecified: Secondary | ICD-10-CM | POA: Diagnosis not present

## 2020-10-31 DIAGNOSIS — Z0000 Encounter for general adult medical examination without abnormal findings: Secondary | ICD-10-CM | POA: Diagnosis not present

## 2020-10-31 DIAGNOSIS — Z1389 Encounter for screening for other disorder: Secondary | ICD-10-CM | POA: Diagnosis not present

## 2020-10-31 DIAGNOSIS — N183 Chronic kidney disease, stage 3 unspecified: Secondary | ICD-10-CM | POA: Diagnosis not present

## 2020-10-31 DIAGNOSIS — N898 Other specified noninflammatory disorders of vagina: Secondary | ICD-10-CM | POA: Diagnosis not present

## 2020-11-19 DIAGNOSIS — I10 Essential (primary) hypertension: Secondary | ICD-10-CM | POA: Diagnosis not present

## 2020-11-19 DIAGNOSIS — N183 Chronic kidney disease, stage 3 unspecified: Secondary | ICD-10-CM | POA: Diagnosis not present

## 2020-11-19 DIAGNOSIS — Z853 Personal history of malignant neoplasm of breast: Secondary | ICD-10-CM | POA: Diagnosis not present

## 2020-11-19 DIAGNOSIS — E785 Hyperlipidemia, unspecified: Secondary | ICD-10-CM | POA: Diagnosis not present

## 2020-11-20 DIAGNOSIS — N1831 Chronic kidney disease, stage 3a: Secondary | ICD-10-CM | POA: Diagnosis not present

## 2020-11-28 DIAGNOSIS — N183 Chronic kidney disease, stage 3 unspecified: Secondary | ICD-10-CM | POA: Diagnosis not present

## 2020-11-28 DIAGNOSIS — N1831 Chronic kidney disease, stage 3a: Secondary | ICD-10-CM | POA: Diagnosis not present

## 2020-11-28 DIAGNOSIS — I129 Hypertensive chronic kidney disease with stage 1 through stage 4 chronic kidney disease, or unspecified chronic kidney disease: Secondary | ICD-10-CM | POA: Diagnosis not present

## 2020-12-05 ENCOUNTER — Other Ambulatory Visit: Payer: Self-pay

## 2020-12-05 ENCOUNTER — Ambulatory Visit
Admission: RE | Admit: 2020-12-05 | Discharge: 2020-12-05 | Disposition: A | Discharge status: Home / Self Care | Payer: PPO | Source: Ambulatory Visit | Attending: Internal Medicine | Admitting: Internal Medicine

## 2020-12-05 DIAGNOSIS — Z1231 Encounter for screening mammogram for malignant neoplasm of breast: Secondary | ICD-10-CM | POA: Diagnosis not present

## 2021-02-13 DIAGNOSIS — I10 Essential (primary) hypertension: Secondary | ICD-10-CM | POA: Diagnosis not present

## 2021-02-13 DIAGNOSIS — E785 Hyperlipidemia, unspecified: Secondary | ICD-10-CM | POA: Diagnosis not present

## 2021-02-13 DIAGNOSIS — N183 Chronic kidney disease, stage 3 unspecified: Secondary | ICD-10-CM | POA: Diagnosis not present

## 2021-04-10 DIAGNOSIS — Z961 Presence of intraocular lens: Secondary | ICD-10-CM | POA: Diagnosis not present

## 2021-04-10 DIAGNOSIS — H31093 Other chorioretinal scars, bilateral: Secondary | ICD-10-CM | POA: Diagnosis not present

## 2021-04-10 DIAGNOSIS — Z9889 Other specified postprocedural states: Secondary | ICD-10-CM | POA: Diagnosis not present

## 2021-05-06 DIAGNOSIS — E2839 Other primary ovarian failure: Secondary | ICD-10-CM | POA: Diagnosis not present

## 2021-05-06 DIAGNOSIS — R7303 Prediabetes: Secondary | ICD-10-CM | POA: Diagnosis not present

## 2021-05-06 DIAGNOSIS — N183 Chronic kidney disease, stage 3 unspecified: Secondary | ICD-10-CM | POA: Diagnosis not present

## 2021-05-06 DIAGNOSIS — I10 Essential (primary) hypertension: Secondary | ICD-10-CM | POA: Diagnosis not present

## 2021-05-13 DIAGNOSIS — D638 Anemia in other chronic diseases classified elsewhere: Secondary | ICD-10-CM | POA: Diagnosis not present

## 2021-05-13 DIAGNOSIS — Z853 Personal history of malignant neoplasm of breast: Secondary | ICD-10-CM | POA: Diagnosis not present

## 2021-05-13 DIAGNOSIS — E785 Hyperlipidemia, unspecified: Secondary | ICD-10-CM | POA: Diagnosis not present

## 2021-05-13 DIAGNOSIS — N183 Chronic kidney disease, stage 3 unspecified: Secondary | ICD-10-CM | POA: Diagnosis not present

## 2021-05-13 DIAGNOSIS — I10 Essential (primary) hypertension: Secondary | ICD-10-CM | POA: Diagnosis not present

## 2021-05-20 ENCOUNTER — Other Ambulatory Visit: Payer: Self-pay | Admitting: Internal Medicine

## 2021-05-21 ENCOUNTER — Other Ambulatory Visit: Payer: Self-pay | Admitting: Internal Medicine

## 2021-05-21 DIAGNOSIS — I10 Essential (primary) hypertension: Secondary | ICD-10-CM

## 2021-06-03 DIAGNOSIS — N1831 Chronic kidney disease, stage 3a: Secondary | ICD-10-CM | POA: Diagnosis not present

## 2021-06-06 ENCOUNTER — Other Ambulatory Visit: Payer: Self-pay | Admitting: Internal Medicine

## 2021-06-06 DIAGNOSIS — E2839 Other primary ovarian failure: Secondary | ICD-10-CM

## 2021-06-10 DIAGNOSIS — N183 Chronic kidney disease, stage 3 unspecified: Secondary | ICD-10-CM | POA: Diagnosis not present

## 2021-06-10 DIAGNOSIS — I129 Hypertensive chronic kidney disease with stage 1 through stage 4 chronic kidney disease, or unspecified chronic kidney disease: Secondary | ICD-10-CM | POA: Diagnosis not present

## 2021-06-10 DIAGNOSIS — N1831 Chronic kidney disease, stage 3a: Secondary | ICD-10-CM | POA: Diagnosis not present

## 2021-07-31 DIAGNOSIS — N1831 Chronic kidney disease, stage 3a: Secondary | ICD-10-CM | POA: Diagnosis not present

## 2021-07-31 DIAGNOSIS — M5136 Other intervertebral disc degeneration, lumbar region: Secondary | ICD-10-CM | POA: Diagnosis not present

## 2021-07-31 DIAGNOSIS — M1990 Unspecified osteoarthritis, unspecified site: Secondary | ICD-10-CM | POA: Diagnosis not present

## 2021-07-31 DIAGNOSIS — K5900 Constipation, unspecified: Secondary | ICD-10-CM | POA: Diagnosis not present

## 2021-07-31 DIAGNOSIS — G8929 Other chronic pain: Secondary | ICD-10-CM | POA: Diagnosis not present

## 2021-07-31 DIAGNOSIS — D509 Iron deficiency anemia, unspecified: Secondary | ICD-10-CM | POA: Diagnosis not present

## 2021-07-31 DIAGNOSIS — Z7982 Long term (current) use of aspirin: Secondary | ICD-10-CM | POA: Diagnosis not present

## 2021-07-31 DIAGNOSIS — D573 Sickle-cell trait: Secondary | ICD-10-CM | POA: Diagnosis not present

## 2021-07-31 DIAGNOSIS — I10 Essential (primary) hypertension: Secondary | ICD-10-CM | POA: Diagnosis not present

## 2021-07-31 DIAGNOSIS — E663 Overweight: Secondary | ICD-10-CM | POA: Diagnosis not present

## 2021-07-31 DIAGNOSIS — E785 Hyperlipidemia, unspecified: Secondary | ICD-10-CM | POA: Diagnosis not present

## 2021-07-31 DIAGNOSIS — F419 Anxiety disorder, unspecified: Secondary | ICD-10-CM | POA: Diagnosis not present

## 2021-08-05 ENCOUNTER — Other Ambulatory Visit: Payer: PPO

## 2021-08-19 ENCOUNTER — Ambulatory Visit
Admission: RE | Admit: 2021-08-19 | Discharge: 2021-08-19 | Disposition: A | Discharge status: Home / Self Care | Payer: PPO | Source: Ambulatory Visit | Attending: Internal Medicine | Admitting: Internal Medicine

## 2021-08-19 DIAGNOSIS — E2839 Other primary ovarian failure: Secondary | ICD-10-CM

## 2021-08-19 DIAGNOSIS — Z780 Asymptomatic menopausal state: Secondary | ICD-10-CM | POA: Diagnosis not present

## 2021-08-28 DIAGNOSIS — I10 Essential (primary) hypertension: Secondary | ICD-10-CM | POA: Diagnosis not present

## 2021-08-28 DIAGNOSIS — N1832 Chronic kidney disease, stage 3b: Secondary | ICD-10-CM | POA: Diagnosis not present

## 2021-08-28 DIAGNOSIS — I129 Hypertensive chronic kidney disease with stage 1 through stage 4 chronic kidney disease, or unspecified chronic kidney disease: Secondary | ICD-10-CM | POA: Diagnosis not present

## 2021-08-28 DIAGNOSIS — T8089XD Other complications following infusion, transfusion and therapeutic injection, subsequent encounter: Secondary | ICD-10-CM | POA: Diagnosis not present

## 2021-10-02 ENCOUNTER — Other Ambulatory Visit: Payer: Self-pay | Admitting: Internal Medicine

## 2021-10-02 DIAGNOSIS — Z1231 Encounter for screening mammogram for malignant neoplasm of breast: Secondary | ICD-10-CM

## 2021-10-23 DIAGNOSIS — E782 Mixed hyperlipidemia: Secondary | ICD-10-CM | POA: Diagnosis not present

## 2021-10-23 DIAGNOSIS — D638 Anemia in other chronic diseases classified elsewhere: Secondary | ICD-10-CM | POA: Diagnosis not present

## 2021-10-23 DIAGNOSIS — I10 Essential (primary) hypertension: Secondary | ICD-10-CM | POA: Diagnosis not present

## 2021-10-30 DIAGNOSIS — Z0000 Encounter for general adult medical examination without abnormal findings: Secondary | ICD-10-CM | POA: Diagnosis not present

## 2021-10-30 DIAGNOSIS — R739 Hyperglycemia, unspecified: Secondary | ICD-10-CM | POA: Diagnosis not present

## 2021-10-30 DIAGNOSIS — N1832 Chronic kidney disease, stage 3b: Secondary | ICD-10-CM | POA: Diagnosis not present

## 2021-10-30 DIAGNOSIS — I129 Hypertensive chronic kidney disease with stage 1 through stage 4 chronic kidney disease, or unspecified chronic kidney disease: Secondary | ICD-10-CM | POA: Diagnosis not present

## 2021-10-30 DIAGNOSIS — I10 Essential (primary) hypertension: Secondary | ICD-10-CM | POA: Diagnosis not present

## 2021-10-30 DIAGNOSIS — D638 Anemia in other chronic diseases classified elsewhere: Secondary | ICD-10-CM | POA: Diagnosis not present

## 2021-10-30 DIAGNOSIS — Z1331 Encounter for screening for depression: Secondary | ICD-10-CM | POA: Diagnosis not present

## 2021-10-30 DIAGNOSIS — E782 Mixed hyperlipidemia: Secondary | ICD-10-CM | POA: Diagnosis not present

## 2021-11-06 DIAGNOSIS — N1832 Chronic kidney disease, stage 3b: Secondary | ICD-10-CM | POA: Diagnosis not present

## 2021-11-06 DIAGNOSIS — I10 Essential (primary) hypertension: Secondary | ICD-10-CM | POA: Diagnosis not present

## 2021-11-06 DIAGNOSIS — E785 Hyperlipidemia, unspecified: Secondary | ICD-10-CM | POA: Diagnosis not present

## 2021-12-11 ENCOUNTER — Ambulatory Visit
Admission: RE | Admit: 2021-12-11 | Discharge: 2021-12-11 | Disposition: A | Discharge status: Home / Self Care | Payer: PPO | Source: Ambulatory Visit | Attending: Internal Medicine | Admitting: Internal Medicine

## 2021-12-11 DIAGNOSIS — Z1231 Encounter for screening mammogram for malignant neoplasm of breast: Secondary | ICD-10-CM | POA: Diagnosis not present

## 2021-12-16 DIAGNOSIS — N1831 Chronic kidney disease, stage 3a: Secondary | ICD-10-CM | POA: Diagnosis not present

## 2021-12-17 DIAGNOSIS — N1831 Chronic kidney disease, stage 3a: Secondary | ICD-10-CM | POA: Diagnosis not present

## 2021-12-23 DIAGNOSIS — N183 Chronic kidney disease, stage 3 unspecified: Secondary | ICD-10-CM | POA: Diagnosis not present

## 2021-12-23 DIAGNOSIS — N1832 Chronic kidney disease, stage 3b: Secondary | ICD-10-CM | POA: Diagnosis not present

## 2021-12-23 DIAGNOSIS — I129 Hypertensive chronic kidney disease with stage 1 through stage 4 chronic kidney disease, or unspecified chronic kidney disease: Secondary | ICD-10-CM | POA: Diagnosis not present

## 2022-01-15 ENCOUNTER — Encounter (INDEPENDENT_AMBULATORY_CARE_PROVIDER_SITE_OTHER): Payer: Self-pay | Admitting: Ophthalmology

## 2022-01-15 ENCOUNTER — Ambulatory Visit (INDEPENDENT_AMBULATORY_CARE_PROVIDER_SITE_OTHER): Payer: PPO | Admitting: Ophthalmology

## 2022-01-15 DIAGNOSIS — H35341 Macular cyst, hole, or pseudohole, right eye: Secondary | ICD-10-CM | POA: Diagnosis not present

## 2022-01-15 DIAGNOSIS — H35342 Macular cyst, hole, or pseudohole, left eye: Secondary | ICD-10-CM

## 2022-01-15 NOTE — Assessment & Plan Note (Signed)
OD doing well excellent repair macular hole.  Tiny remnant of the photoreceptor layer, stable over time no sign of recurrence

## 2022-01-15 NOTE — Assessment & Plan Note (Signed)
OS stable post macular hole repair.  Doing well.  Good acuity stable over time no sign of recurrence

## 2022-01-15 NOTE — Progress Notes (Signed)
01/15/2022     CHIEF COMPLAINT Patient presents for  Chief Complaint  Patient presents with   Retina Evaluation      HISTORY OF PRESENT ILLNESS: Sylvia Simpson is a 76 y.o. female who presents to the clinic today for:   HPI   Macular hole of left eye 2 year fu ou oct fundus  Pt states her vision has been stable Pt denies any new floaters or FOL Last edited by Morene Rankins, CMA on 01/15/2022  8:21 AM.      Referring physician: Clent Jacks, MD [place],  [place] [id number]  HISTORICAL INFORMATION:   Selected notes from the [facility]: No current outpatient medications on file. (Ophthalmic Drugs)   No current facility-administered medications for this visit. (Ophthalmic Drugs)   Current Outpatient Medications (Other)  Medication Sig   aspirin 325 MG EC tablet Take 325 mg by mouth daily.   Cinnamon Bark POWD by Does not apply route.   co-enzyme Q-10 30 MG capsule Take 30 mg by mouth 3 (three) times daily.   enalapril (VASOTEC) 20 MG tablet Take 20 mg by mouth daily.   Ferrous Sulfate (FERATAB PO) Take 325 mg by mouth.   gabapentin (NEURONTIN) 300 MG capsule Take 300 mg by mouth at bedtime.   Garlic 1696 MG CAPS Take by mouth.   hydrochlorothiazide (HYDRODIURIL) 25 MG tablet Take 25 mg by mouth daily.   Lecithin 400 MG CAPS Take by mouth.   LORazepam (ATIVAN) 1 MG tablet Take 1 mg by mouth 2 (two) times daily.   metoprolol tartrate (LOPRESSOR) 25 MG tablet Take 25 mg by mouth 2 (two) times daily.   Multiple Vitamin (MULTIVITAMIN WITH MINERALS) TABS tablet Take 1 tablet by mouth daily.   Omega 3 1000 MG CAPS Take by mouth 3 (three) times daily.   oxyCODONE (OXY IR/ROXICODONE) 5 MG immediate release tablet Take 1-2 tablets (5-10 mg total) by mouth every 6 (six) hours as needed for moderate pain, severe pain or breakthrough pain.   polycarbophil (FIBERCON) 625 MG tablet Take 625 mg by mouth daily.    polyethylene glycol (MIRALAX / GLYCOLAX) packet Take 17 g by mouth daily.   pravastatin (PRAVACHOL) 20 MG tablet Take 20 mg by mouth daily.   Probiotic Product (CULTURELLE PRO-WELL PO) Take by mouth.   traMADol (ULTRAM) 50 MG tablet Take by mouth every 6 (six) hours as needed.   vitamin A 8000 UNIT capsule Take 8,000 Units by mouth daily.   vitamin C (ASCORBIC ACID) 500 MG tablet Take 500 mg by mouth daily.   No current facility-administered medications for this visit. (Other)      REVIEW OF SYSTEMS: ROS   Negative for: Constitutional, Gastrointestinal, Neurological, Skin, Genitourinary, Musculoskeletal, HENT, Endocrine, Cardiovascular, Eyes, Respiratory, Psychiatric, Allergic/Imm, Heme/Lymph Last edited by Orene Desanctis D, CMA on 01/15/2022  8:21 AM.       ALLERGIES Allergies  Allergen Reactions   Latex Rash    PAST MEDICAL HISTORY Past Medical History:  Diagnosis Date   Anxiety    Asthma    as child, no issues now   Cancer [facility]) 1997   right breast cancer-lumpectomy,chemo and radiation   Hypertension    Lumbar radiculopathy, right 12/14/2017   L5   Peripheral neuropathy 12/14/2017   Past Surgical History:  Procedure Laterality Date   ABDOMINAL HYSTERECTOMY     BACK SURGERY     BREAST  EXCISIONAL BIOPSY Left 2019   benign   BREAST LUMPECTOMY Right 1997   CHOLECYSTECTOMY     EYE SURGERY     retinal detachment   RADIOACTIVE SEED GUIDED EXCISIONAL BREAST BIOPSY Left 06/01/2017   Procedure: LEFT RADIOACTIVE SEED GUIDED EXCISIONAL BREAST BIOPSY ERAS PATHWAY;  Surgeon: [name], MD;  Location: [facility];  Service: General;  Laterality: Left;   TUBAL LIGATION      FAMILY HISTORY Family History  Problem Relation Age of Onset   Breast cancer Paternal Aunt 82    SOCIAL HISTORY Social History   Tobacco Use   Smoking status: Former   Smokeless tobacco: Never  Substance Use Topics   Alcohol use: Yes    Comment: social   Drug use: No          OPHTHALMIC EXAM:  Base Eye Exam     Visual Acuity (ETDRS)       Right Left   Dist cc 20/40 -2 20/40 +1    Correction: Glasses         Tonometry (Tonopen, 8:26 AM)       Right Left   Pressure 5 7         Pupils       Pupils   Right PERRL   Left PERRL         Extraocular Movement       Right Left    Ortho Ortho    -- -- --  --  --  -- -- --   -- -- --  --  --  -- -- --           Neuro/Psych     Oriented x3: Yes   Mood/Affect: Normal         Dilation     Both eyes: 1.0% Mydriacyl, 2.5% Phenylephrine @ 8:22 AM           Slit Lamp and Fundus Exam     External Exam       Right Left   External Normal Normal         Slit Lamp Exam       Right Left   Lids/Lashes Normal Normal   Conjunctiva/Sclera White and quiet White and quiet   Cornea Clear Clear   Anterior Chamber Deep and quiet Deep and quiet   Iris Round and reactive Round and reactive   Lens Centered posterior chamber intraocular lens Centered posterior chamber intraocular lens   Anterior Vitreous Normal Normal         Fundus Exam       Right Left   Posterior Vitreous Clear vitrectomized Clear vitrectomized   Disc Normal Normal   C/D Ratio 0.45 0.45   Macula Normal Normal   Vessels Normal Normal   Periphery Normal no holes or tears Pigmented chorioretinal scarring inferotemporal and temporal in region of prior retinal detachment, no new retinal holes            IMAGING AND PROCEDURES  Imaging and Procedures for 01/15/22  OCT, Retina - OU - Both Eyes       Right Eye Quality was good. Scan locations included subfoveal. Central Foveal Thickness: 303. Findings include abnormal foveal contour.   Left Eye Quality was good. Central Foveal Thickness: 269. Progression has improved. Findings include abnormal foveal contour.   Notes History of macular hole surgery and repair OU doing very well with stable acuity now for years     Color Fundus Photography  [employer] -  OU - Both Eyes       Right Eye Progression has been stable. Disc findings include normal observations. Macula : normal observations. Vessels : normal observations.   Left Eye Progression has been stable. Disc findings include normal observations. Macula : normal observations. Vessels : normal observations.   Notes OS, chorioretinal scarring inferotemporal and temporal in the region of prior retinal detachment.  Retina is attached.  A prior macular hole remains closed.  OD, history of macular hole, stable with excellent visual acuity as well             ASSESSMENT/PLAN:  Macular hole of right eye OD doing well excellent repair macular hole.  Tiny remnant of the photoreceptor layer, stable over time no sign of recurrence  Macular hole of left eye OS stable post macular hole repair.  Doing well.  Good acuity stable over time no sign of recurrence     ICD-10-CM   1. Macular hole of left eye  H35.342 OCT, Retina - OU - Both Eyes    Color Fundus Photography Optos - OU - Both Eyes    2. Macular hole of right eye  H35.341       1.  OU doing well.  Macular hole is closed visual acuity stable.  Follow-up 1 year  2.  Follow-up with Dr. Katy Fitch as scheduled  3.  Ophthalmic Meds Ordered this visit:  No orders of the defined types were placed in this encounter.      Return in about 1 year (around 01/16/2023) for DILATE OU, OCT.  There are no Patient Instructions on file for this visit.   Explained the diagnoses, plan, and follow up with the patient and they expressed understanding.  Patient expressed understanding of the importance of proper follow up care.   Clent Demark Sakai Wolford M.D. Diseases & Surgery of the Retina and Vitreous [facility] [place] 01/15/22     Abbreviations: M myopia (nearsighted); A astigmatism; H hyperopia (farsighted); P presbyopia; Mrx spectacle prescription;  CTL contact lenses; OD right eye; OS left eye; OU both eyes  XT exotropia;  ET esotropia; PEK punctate epithelial keratitis; PEE punctate epithelial erosions; DES dry eye syndrome; MGD meibomian gland dysfunction; ATs artificial tears; PFAT's preservative free artificial tears; [place] nuclear sclerotic cataract; PSC posterior subcapsular cataract; ERM epi-retinal membrane; PVD posterior vitreous detachment; RD retinal detachment; DM diabetes mellitus; DR diabetic retinopathy; NPDR non-proliferative diabetic retinopathy; PDR proliferative diabetic retinopathy; CSME clinically significant macular edema; DME diabetic macular edema; dbh dot blot hemorrhages; CWS cotton wool spot; POAG primary open angle glaucoma; C/D cup-to-disc ratio; HVF humphrey visual field; GVF goldmann visual field; OCT optical coherence tomography; IOP intraocular pressure; BRVO Branch retinal vein occlusion; CRVO central retinal vein occlusion; CRAO central retinal artery occlusion; BRAO branch retinal artery occlusion; RT retinal tear; SB scleral buckle; PPV pars plana vitrectomy; VH Vitreous hemorrhage; PRP panretinal laser photocoagulation; IVK intravitreal kenalog; VMT vitreomacular traction; MH Macular hole;  NVD neovascularization of the disc; NVE neovascularization elsewhere; AREDS age related eye disease study; ARMD age related macular degeneration; POAG primary open angle glaucoma; EBMD epithelial/anterior basement membrane dystrophy; ACIOL anterior chamber intraocular lens; IOL intraocular lens; PCIOL posterior chamber intraocular lens; Phaco/IOL phacoemulsification with intraocular lens placement; [place] photorefractive keratectomy; LASIK laser assisted in situ keratomileusis; HTN hypertension; DM diabetes mellitus; COPD chronic obstructive pulmonary disease

## 2022-01-22 ENCOUNTER — Encounter (INDEPENDENT_AMBULATORY_CARE_PROVIDER_SITE_OTHER): Payer: PPO | Admitting: Ophthalmology

## 2022-01-27 DIAGNOSIS — E782 Mixed hyperlipidemia: Secondary | ICD-10-CM | POA: Diagnosis not present

## 2022-01-29 DIAGNOSIS — R7303 Prediabetes: Secondary | ICD-10-CM | POA: Diagnosis not present

## 2022-01-29 DIAGNOSIS — N183 Chronic kidney disease, stage 3 unspecified: Secondary | ICD-10-CM | POA: Diagnosis not present

## 2022-01-29 DIAGNOSIS — Z23 Encounter for immunization: Secondary | ICD-10-CM | POA: Diagnosis not present

## 2022-01-29 DIAGNOSIS — I10 Essential (primary) hypertension: Secondary | ICD-10-CM | POA: Diagnosis not present

## 2022-01-29 DIAGNOSIS — N1832 Chronic kidney disease, stage 3b: Secondary | ICD-10-CM | POA: Diagnosis not present

## 2022-01-29 DIAGNOSIS — E785 Hyperlipidemia, unspecified: Secondary | ICD-10-CM | POA: Diagnosis not present

## 2022-04-23 DIAGNOSIS — H31093 Other chorioretinal scars, bilateral: Secondary | ICD-10-CM | POA: Diagnosis not present

## 2022-04-23 DIAGNOSIS — Z961 Presence of intraocular lens: Secondary | ICD-10-CM | POA: Diagnosis not present

## 2022-04-23 DIAGNOSIS — Z9889 Other specified postprocedural states: Secondary | ICD-10-CM | POA: Diagnosis not present

## 2022-06-18 DIAGNOSIS — N1832 Chronic kidney disease, stage 3b: Secondary | ICD-10-CM | POA: Diagnosis not present

## 2022-06-25 DIAGNOSIS — I129 Hypertensive chronic kidney disease with stage 1 through stage 4 chronic kidney disease, or unspecified chronic kidney disease: Secondary | ICD-10-CM | POA: Diagnosis not present

## 2022-06-25 DIAGNOSIS — E7800 Pure hypercholesterolemia, unspecified: Secondary | ICD-10-CM | POA: Diagnosis not present

## 2022-06-25 DIAGNOSIS — N1832 Chronic kidney disease, stage 3b: Secondary | ICD-10-CM | POA: Diagnosis not present

## 2022-06-25 DIAGNOSIS — R7303 Prediabetes: Secondary | ICD-10-CM | POA: Diagnosis not present

## 2022-07-23 DIAGNOSIS — I10 Essential (primary) hypertension: Secondary | ICD-10-CM | POA: Diagnosis not present

## 2022-07-23 DIAGNOSIS — E782 Mixed hyperlipidemia: Secondary | ICD-10-CM | POA: Diagnosis not present

## 2022-07-30 DIAGNOSIS — D638 Anemia in other chronic diseases classified elsewhere: Secondary | ICD-10-CM | POA: Diagnosis not present

## 2022-07-30 DIAGNOSIS — E782 Mixed hyperlipidemia: Secondary | ICD-10-CM | POA: Diagnosis not present

## 2022-07-30 DIAGNOSIS — I10 Essential (primary) hypertension: Secondary | ICD-10-CM | POA: Diagnosis not present

## 2022-07-30 DIAGNOSIS — R7303 Prediabetes: Secondary | ICD-10-CM | POA: Diagnosis not present

## 2022-07-30 DIAGNOSIS — N1831 Chronic kidney disease, stage 3a: Secondary | ICD-10-CM | POA: Diagnosis not present

## 2022-11-16 ENCOUNTER — Other Ambulatory Visit: Payer: Self-pay | Admitting: Internal Medicine

## 2022-11-16 DIAGNOSIS — Z1231 Encounter for screening mammogram for malignant neoplasm of breast: Secondary | ICD-10-CM

## 2022-11-19 DIAGNOSIS — Z1331 Encounter for screening for depression: Secondary | ICD-10-CM | POA: Diagnosis not present

## 2022-11-19 DIAGNOSIS — I10 Essential (primary) hypertension: Secondary | ICD-10-CM | POA: Diagnosis not present

## 2022-11-19 DIAGNOSIS — R7303 Prediabetes: Secondary | ICD-10-CM | POA: Diagnosis not present

## 2022-11-19 DIAGNOSIS — Z0000 Encounter for general adult medical examination without abnormal findings: Secondary | ICD-10-CM | POA: Diagnosis not present

## 2022-11-19 DIAGNOSIS — R7309 Other abnormal glucose: Secondary | ICD-10-CM | POA: Diagnosis not present

## 2022-11-19 DIAGNOSIS — E782 Mixed hyperlipidemia: Secondary | ICD-10-CM | POA: Diagnosis not present

## 2022-11-19 DIAGNOSIS — D638 Anemia in other chronic diseases classified elsewhere: Secondary | ICD-10-CM | POA: Diagnosis not present

## 2022-11-24 DIAGNOSIS — I10 Essential (primary) hypertension: Secondary | ICD-10-CM | POA: Diagnosis not present

## 2022-11-24 DIAGNOSIS — E041 Nontoxic single thyroid nodule: Secondary | ICD-10-CM | POA: Diagnosis not present

## 2022-11-24 DIAGNOSIS — I700 Atherosclerosis of aorta: Secondary | ICD-10-CM | POA: Diagnosis not present

## 2022-11-24 DIAGNOSIS — F419 Anxiety disorder, unspecified: Secondary | ICD-10-CM | POA: Diagnosis not present

## 2022-11-24 DIAGNOSIS — D573 Sickle-cell trait: Secondary | ICD-10-CM | POA: Diagnosis not present

## 2022-11-24 DIAGNOSIS — G8929 Other chronic pain: Secondary | ICD-10-CM | POA: Diagnosis not present

## 2022-11-24 DIAGNOSIS — E663 Overweight: Secondary | ICD-10-CM | POA: Diagnosis not present

## 2022-11-24 DIAGNOSIS — E785 Hyperlipidemia, unspecified: Secondary | ICD-10-CM | POA: Diagnosis not present

## 2022-11-24 DIAGNOSIS — G629 Polyneuropathy, unspecified: Secondary | ICD-10-CM | POA: Diagnosis not present

## 2022-11-24 DIAGNOSIS — I129 Hypertensive chronic kidney disease with stage 1 through stage 4 chronic kidney disease, or unspecified chronic kidney disease: Secondary | ICD-10-CM | POA: Diagnosis not present

## 2022-11-24 DIAGNOSIS — D509 Iron deficiency anemia, unspecified: Secondary | ICD-10-CM | POA: Diagnosis not present

## 2022-11-24 DIAGNOSIS — N183 Chronic kidney disease, stage 3 unspecified: Secondary | ICD-10-CM | POA: Diagnosis not present

## 2022-12-24 ENCOUNTER — Ambulatory Visit: Payer: PPO

## 2022-12-31 DIAGNOSIS — E785 Hyperlipidemia, unspecified: Secondary | ICD-10-CM | POA: Diagnosis not present

## 2022-12-31 DIAGNOSIS — I10 Essential (primary) hypertension: Secondary | ICD-10-CM | POA: Diagnosis not present

## 2022-12-31 DIAGNOSIS — G629 Polyneuropathy, unspecified: Secondary | ICD-10-CM | POA: Diagnosis not present

## 2022-12-31 DIAGNOSIS — I129 Hypertensive chronic kidney disease with stage 1 through stage 4 chronic kidney disease, or unspecified chronic kidney disease: Secondary | ICD-10-CM | POA: Diagnosis not present

## 2022-12-31 DIAGNOSIS — Z853 Personal history of malignant neoplasm of breast: Secondary | ICD-10-CM | POA: Diagnosis not present

## 2022-12-31 DIAGNOSIS — R7303 Prediabetes: Secondary | ICD-10-CM | POA: Diagnosis not present

## 2022-12-31 DIAGNOSIS — N1832 Chronic kidney disease, stage 3b: Secondary | ICD-10-CM | POA: Diagnosis not present

## 2022-12-31 DIAGNOSIS — Z0000 Encounter for general adult medical examination without abnormal findings: Secondary | ICD-10-CM | POA: Diagnosis not present

## 2022-12-31 DIAGNOSIS — D638 Anemia in other chronic diseases classified elsewhere: Secondary | ICD-10-CM | POA: Diagnosis not present

## 2023-01-05 DIAGNOSIS — N1832 Chronic kidney disease, stage 3b: Secondary | ICD-10-CM | POA: Diagnosis not present

## 2023-01-05 DIAGNOSIS — I129 Hypertensive chronic kidney disease with stage 1 through stage 4 chronic kidney disease, or unspecified chronic kidney disease: Secondary | ICD-10-CM | POA: Diagnosis not present

## 2023-01-07 ENCOUNTER — Ambulatory Visit
Admission: RE | Admit: 2023-01-07 | Discharge: 2023-01-07 | Disposition: A | Discharge status: Home / Self Care | Payer: PPO | Source: Ambulatory Visit | Attending: Internal Medicine | Admitting: Internal Medicine

## 2023-01-07 DIAGNOSIS — Z1231 Encounter for screening mammogram for malignant neoplasm of breast: Secondary | ICD-10-CM | POA: Diagnosis not present

## 2023-01-07 HISTORY — DX: Personal history of irradiation: Z92.3

## 2023-01-07 HISTORY — DX: Personal history of antineoplastic chemotherapy: Z92.21

## 2023-01-11 ENCOUNTER — Other Ambulatory Visit: Payer: Self-pay | Admitting: Internal Medicine

## 2023-01-11 DIAGNOSIS — R928 Other abnormal and inconclusive findings on diagnostic imaging of breast: Secondary | ICD-10-CM

## 2023-01-15 ENCOUNTER — Other Ambulatory Visit: Payer: Self-pay | Admitting: Internal Medicine

## 2023-01-15 ENCOUNTER — Ambulatory Visit
Admission: RE | Admit: 2023-01-15 | Discharge: 2023-01-15 | Disposition: A | Discharge status: Home / Self Care | Payer: PPO | Source: Ambulatory Visit | Attending: Internal Medicine | Admitting: Internal Medicine

## 2023-01-15 DIAGNOSIS — R928 Other abnormal and inconclusive findings on diagnostic imaging of breast: Secondary | ICD-10-CM

## 2023-01-15 DIAGNOSIS — R590 Localized enlarged lymph nodes: Secondary | ICD-10-CM | POA: Diagnosis not present

## 2023-01-18 ENCOUNTER — Encounter (INDEPENDENT_AMBULATORY_CARE_PROVIDER_SITE_OTHER): Payer: PPO | Admitting: Ophthalmology

## 2023-03-22 DIAGNOSIS — E892 Postprocedural hypoparathyroidism: Secondary | ICD-10-CM | POA: Diagnosis not present

## 2023-03-22 DIAGNOSIS — Z8639 Personal history of other endocrine, nutritional and metabolic disease: Secondary | ICD-10-CM | POA: Diagnosis not present

## 2023-03-22 DIAGNOSIS — Z853 Personal history of malignant neoplasm of breast: Secondary | ICD-10-CM | POA: Diagnosis not present

## 2023-03-22 DIAGNOSIS — I10 Essential (primary) hypertension: Secondary | ICD-10-CM | POA: Diagnosis not present

## 2023-03-22 DIAGNOSIS — F419 Anxiety disorder, unspecified: Secondary | ICD-10-CM | POA: Diagnosis not present

## 2023-03-22 DIAGNOSIS — N1832 Chronic kidney disease, stage 3b: Secondary | ICD-10-CM | POA: Diagnosis not present

## 2023-03-22 DIAGNOSIS — Z9889 Other specified postprocedural states: Secondary | ICD-10-CM | POA: Diagnosis not present

## 2023-04-02 ENCOUNTER — Other Ambulatory Visit: Payer: Self-pay | Admitting: Internal Medicine

## 2023-04-02 DIAGNOSIS — R599 Enlarged lymph nodes, unspecified: Secondary | ICD-10-CM

## 2023-04-09 ENCOUNTER — Ambulatory Visit
Admission: RE | Admit: 2023-04-09 | Discharge: 2023-04-09 | Disposition: A | Discharge status: Home / Self Care | Payer: PPO | Source: Ambulatory Visit | Attending: Internal Medicine | Admitting: Internal Medicine

## 2023-04-09 DIAGNOSIS — R590 Localized enlarged lymph nodes: Secondary | ICD-10-CM | POA: Diagnosis not present

## 2023-04-09 DIAGNOSIS — R599 Enlarged lymph nodes, unspecified: Secondary | ICD-10-CM

## 2023-04-09 DIAGNOSIS — Z853 Personal history of malignant neoplasm of breast: Secondary | ICD-10-CM | POA: Diagnosis not present

## 2023-06-30 DIAGNOSIS — N1832 Chronic kidney disease, stage 3b: Secondary | ICD-10-CM | POA: Diagnosis not present

## 2023-07-01 DIAGNOSIS — R7303 Prediabetes: Secondary | ICD-10-CM | POA: Diagnosis not present

## 2023-07-01 DIAGNOSIS — I10 Essential (primary) hypertension: Secondary | ICD-10-CM | POA: Diagnosis not present

## 2023-07-01 DIAGNOSIS — N1832 Chronic kidney disease, stage 3b: Secondary | ICD-10-CM | POA: Diagnosis not present

## 2023-07-01 DIAGNOSIS — E785 Hyperlipidemia, unspecified: Secondary | ICD-10-CM | POA: Diagnosis not present

## 2023-07-06 DIAGNOSIS — Z87891 Personal history of nicotine dependence: Secondary | ICD-10-CM | POA: Diagnosis not present

## 2023-07-06 DIAGNOSIS — I129 Hypertensive chronic kidney disease with stage 1 through stage 4 chronic kidney disease, or unspecified chronic kidney disease: Secondary | ICD-10-CM | POA: Diagnosis not present

## 2023-07-06 DIAGNOSIS — N1832 Chronic kidney disease, stage 3b: Secondary | ICD-10-CM | POA: Diagnosis not present

## 2023-07-22 DIAGNOSIS — R21 Rash and other nonspecific skin eruption: Secondary | ICD-10-CM | POA: Diagnosis not present

## 2023-07-22 DIAGNOSIS — N898 Other specified noninflammatory disorders of vagina: Secondary | ICD-10-CM | POA: Diagnosis not present

## 2023-07-22 DIAGNOSIS — Z853 Personal history of malignant neoplasm of breast: Secondary | ICD-10-CM | POA: Diagnosis not present

## 2023-07-22 DIAGNOSIS — B002 Herpesviral gingivostomatitis and pharyngotonsillitis: Secondary | ICD-10-CM | POA: Diagnosis not present

## 2023-10-04 DIAGNOSIS — N1832 Chronic kidney disease, stage 3b: Secondary | ICD-10-CM | POA: Diagnosis not present

## 2023-10-04 DIAGNOSIS — I10 Essential (primary) hypertension: Secondary | ICD-10-CM | POA: Diagnosis not present

## 2023-10-04 DIAGNOSIS — I129 Hypertensive chronic kidney disease with stage 1 through stage 4 chronic kidney disease, or unspecified chronic kidney disease: Secondary | ICD-10-CM | POA: Diagnosis not present

## 2023-11-02 DIAGNOSIS — I129 Hypertensive chronic kidney disease with stage 1 through stage 4 chronic kidney disease, or unspecified chronic kidney disease: Secondary | ICD-10-CM | POA: Diagnosis not present

## 2023-11-02 DIAGNOSIS — I10 Essential (primary) hypertension: Secondary | ICD-10-CM | POA: Diagnosis not present

## 2023-11-02 DIAGNOSIS — N1832 Chronic kidney disease, stage 3b: Secondary | ICD-10-CM | POA: Diagnosis not present

## 2023-11-18 DIAGNOSIS — N1832 Chronic kidney disease, stage 3b: Secondary | ICD-10-CM | POA: Diagnosis not present

## 2023-11-18 DIAGNOSIS — I10 Essential (primary) hypertension: Secondary | ICD-10-CM | POA: Diagnosis not present

## 2023-11-18 DIAGNOSIS — E782 Mixed hyperlipidemia: Secondary | ICD-10-CM | POA: Diagnosis not present

## 2023-11-18 DIAGNOSIS — I129 Hypertensive chronic kidney disease with stage 1 through stage 4 chronic kidney disease, or unspecified chronic kidney disease: Secondary | ICD-10-CM | POA: Diagnosis not present

## 2023-12-02 DIAGNOSIS — I10 Essential (primary) hypertension: Secondary | ICD-10-CM | POA: Diagnosis not present

## 2023-12-02 DIAGNOSIS — N1832 Chronic kidney disease, stage 3b: Secondary | ICD-10-CM | POA: Diagnosis not present

## 2023-12-02 DIAGNOSIS — I129 Hypertensive chronic kidney disease with stage 1 through stage 4 chronic kidney disease, or unspecified chronic kidney disease: Secondary | ICD-10-CM | POA: Diagnosis not present

## 2023-12-09 ENCOUNTER — Other Ambulatory Visit: Payer: Self-pay | Admitting: Internal Medicine

## 2023-12-09 DIAGNOSIS — Z1231 Encounter for screening mammogram for malignant neoplasm of breast: Secondary | ICD-10-CM

## 2023-12-19 DIAGNOSIS — I129 Hypertensive chronic kidney disease with stage 1 through stage 4 chronic kidney disease, or unspecified chronic kidney disease: Secondary | ICD-10-CM | POA: Diagnosis not present

## 2023-12-19 DIAGNOSIS — E782 Mixed hyperlipidemia: Secondary | ICD-10-CM | POA: Diagnosis not present

## 2023-12-19 DIAGNOSIS — I10 Essential (primary) hypertension: Secondary | ICD-10-CM | POA: Diagnosis not present

## 2023-12-19 DIAGNOSIS — N1832 Chronic kidney disease, stage 3b: Secondary | ICD-10-CM | POA: Diagnosis not present

## 2023-12-29 DIAGNOSIS — N1832 Chronic kidney disease, stage 3b: Secondary | ICD-10-CM | POA: Diagnosis not present

## 2024-01-01 DIAGNOSIS — N1832 Chronic kidney disease, stage 3b: Secondary | ICD-10-CM | POA: Diagnosis not present

## 2024-01-01 DIAGNOSIS — I10 Essential (primary) hypertension: Secondary | ICD-10-CM | POA: Diagnosis not present

## 2024-01-01 DIAGNOSIS — I129 Hypertensive chronic kidney disease with stage 1 through stage 4 chronic kidney disease, or unspecified chronic kidney disease: Secondary | ICD-10-CM | POA: Diagnosis not present

## 2024-01-06 DIAGNOSIS — I129 Hypertensive chronic kidney disease with stage 1 through stage 4 chronic kidney disease, or unspecified chronic kidney disease: Secondary | ICD-10-CM | POA: Diagnosis not present

## 2024-01-06 DIAGNOSIS — N1832 Chronic kidney disease, stage 3b: Secondary | ICD-10-CM | POA: Diagnosis not present

## 2024-01-11 ENCOUNTER — Ambulatory Visit

## 2024-01-12 DIAGNOSIS — N1832 Chronic kidney disease, stage 3b: Secondary | ICD-10-CM | POA: Diagnosis not present

## 2024-01-12 DIAGNOSIS — R7303 Prediabetes: Secondary | ICD-10-CM | POA: Diagnosis not present

## 2024-01-12 DIAGNOSIS — Z1331 Encounter for screening for depression: Secondary | ICD-10-CM | POA: Diagnosis not present

## 2024-01-12 DIAGNOSIS — Z9889 Other specified postprocedural states: Secondary | ICD-10-CM | POA: Diagnosis not present

## 2024-01-12 DIAGNOSIS — Z0000 Encounter for general adult medical examination without abnormal findings: Secondary | ICD-10-CM | POA: Diagnosis not present

## 2024-01-12 DIAGNOSIS — E785 Hyperlipidemia, unspecified: Secondary | ICD-10-CM | POA: Diagnosis not present

## 2024-01-12 DIAGNOSIS — G629 Polyneuropathy, unspecified: Secondary | ICD-10-CM | POA: Diagnosis not present

## 2024-01-12 DIAGNOSIS — Z853 Personal history of malignant neoplasm of breast: Secondary | ICD-10-CM | POA: Diagnosis not present

## 2024-01-12 DIAGNOSIS — Z79899 Other long term (current) drug therapy: Secondary | ICD-10-CM | POA: Diagnosis not present

## 2024-01-12 DIAGNOSIS — I10 Essential (primary) hypertension: Secondary | ICD-10-CM | POA: Diagnosis not present

## 2024-01-18 DIAGNOSIS — E782 Mixed hyperlipidemia: Secondary | ICD-10-CM | POA: Diagnosis not present

## 2024-01-18 DIAGNOSIS — N1832 Chronic kidney disease, stage 3b: Secondary | ICD-10-CM | POA: Diagnosis not present

## 2024-01-18 DIAGNOSIS — I129 Hypertensive chronic kidney disease with stage 1 through stage 4 chronic kidney disease, or unspecified chronic kidney disease: Secondary | ICD-10-CM | POA: Diagnosis not present

## 2024-01-18 DIAGNOSIS — I10 Essential (primary) hypertension: Secondary | ICD-10-CM | POA: Diagnosis not present

## 2024-01-31 DIAGNOSIS — I10 Essential (primary) hypertension: Secondary | ICD-10-CM | POA: Diagnosis not present

## 2024-01-31 DIAGNOSIS — N1832 Chronic kidney disease, stage 3b: Secondary | ICD-10-CM | POA: Diagnosis not present

## 2024-01-31 DIAGNOSIS — I129 Hypertensive chronic kidney disease with stage 1 through stage 4 chronic kidney disease, or unspecified chronic kidney disease: Secondary | ICD-10-CM | POA: Diagnosis not present

## 2024-02-01 DIAGNOSIS — H35342 Macular cyst, hole, or pseudohole, left eye: Secondary | ICD-10-CM | POA: Diagnosis not present

## 2024-02-01 DIAGNOSIS — H35341 Macular cyst, hole, or pseudohole, right eye: Secondary | ICD-10-CM | POA: Diagnosis not present

## 2024-02-18 DIAGNOSIS — E782 Mixed hyperlipidemia: Secondary | ICD-10-CM | POA: Diagnosis not present

## 2024-02-18 DIAGNOSIS — N1832 Chronic kidney disease, stage 3b: Secondary | ICD-10-CM | POA: Diagnosis not present

## 2024-02-18 DIAGNOSIS — I129 Hypertensive chronic kidney disease with stage 1 through stage 4 chronic kidney disease, or unspecified chronic kidney disease: Secondary | ICD-10-CM | POA: Diagnosis not present

## 2024-02-18 DIAGNOSIS — I10 Essential (primary) hypertension: Secondary | ICD-10-CM | POA: Diagnosis not present

## 2024-02-22 DIAGNOSIS — E782 Mixed hyperlipidemia: Secondary | ICD-10-CM | POA: Diagnosis not present

## 2024-03-01 DIAGNOSIS — I129 Hypertensive chronic kidney disease with stage 1 through stage 4 chronic kidney disease, or unspecified chronic kidney disease: Secondary | ICD-10-CM | POA: Diagnosis not present

## 2024-03-01 DIAGNOSIS — I10 Essential (primary) hypertension: Secondary | ICD-10-CM | POA: Diagnosis not present

## 2024-03-01 DIAGNOSIS — N1832 Chronic kidney disease, stage 3b: Secondary | ICD-10-CM | POA: Diagnosis not present

## 2024-03-19 DIAGNOSIS — E782 Mixed hyperlipidemia: Secondary | ICD-10-CM | POA: Diagnosis not present

## 2024-03-19 DIAGNOSIS — I10 Essential (primary) hypertension: Secondary | ICD-10-CM | POA: Diagnosis not present

## 2024-03-19 DIAGNOSIS — N1832 Chronic kidney disease, stage 3b: Secondary | ICD-10-CM | POA: Diagnosis not present

## 2024-03-19 DIAGNOSIS — I129 Hypertensive chronic kidney disease with stage 1 through stage 4 chronic kidney disease, or unspecified chronic kidney disease: Secondary | ICD-10-CM | POA: Diagnosis not present

## 2024-04-07 ENCOUNTER — Other Ambulatory Visit: Payer: Self-pay | Admitting: Medical Genetics

## 2024-04-11 ENCOUNTER — Ambulatory Visit
Admission: RE | Admit: 2024-04-11 | Discharge: 2024-04-11 | Disposition: A | Discharge status: Home / Self Care | Source: Ambulatory Visit | Attending: Internal Medicine | Admitting: Internal Medicine

## 2024-04-11 DIAGNOSIS — Z1231 Encounter for screening mammogram for malignant neoplasm of breast: Secondary | ICD-10-CM

## 2024-05-25 ENCOUNTER — Other Ambulatory Visit

## 2024-05-25 DIAGNOSIS — Z006 Encounter for examination for normal comparison and control in clinical research program: Secondary | ICD-10-CM
